# Patient Record
Sex: Female | Born: 1935
Health system: Southern US, Community
[De-identification: ages and names within clinical notes are randomized; demographics above are authoritative.]

## PROBLEM LIST (undated history)

## (undated) DIAGNOSIS — Z780 Asymptomatic menopausal state: Secondary | ICD-10-CM

## (undated) DIAGNOSIS — M899 Disorder of bone, unspecified: Secondary | ICD-10-CM

## (undated) DIAGNOSIS — Z8669 Personal history of other diseases of the nervous system and sense organs: Secondary | ICD-10-CM

## (undated) DIAGNOSIS — M949 Disorder of cartilage, unspecified: Secondary | ICD-10-CM

## (undated) DIAGNOSIS — Z8679 Personal history of other diseases of the circulatory system: Secondary | ICD-10-CM

## (undated) DIAGNOSIS — R2681 Unsteadiness on feet: Secondary | ICD-10-CM

## (undated) DIAGNOSIS — Z7189 Other specified counseling: Secondary | ICD-10-CM

## (undated) DIAGNOSIS — M810 Age-related osteoporosis without current pathological fracture: Secondary | ICD-10-CM

## (undated) DIAGNOSIS — Z85828 Personal history of other malignant neoplasm of skin: Secondary | ICD-10-CM

## (undated) DIAGNOSIS — R002 Palpitations: Secondary | ICD-10-CM

## (undated) DIAGNOSIS — R05 Cough: Secondary | ICD-10-CM

## (undated) DIAGNOSIS — E785 Hyperlipidemia, unspecified: Secondary | ICD-10-CM

## (undated) DIAGNOSIS — Z950 Presence of cardiac pacemaker: Principal | ICD-10-CM

## (undated) HISTORY — DX: Hyperlipidemia, unspecified: E78.5

## (undated) HISTORY — PX: EYE SURGERY: SHX253

## (undated) HISTORY — PX: TOOTH EXTRACTION: SUR596

## (undated) HISTORY — DX: Personal history of other diseases of the circulatory system: Z86.79

## (undated) HISTORY — DX: Cough: R05

## (undated) HISTORY — DX: Presence of cardiac pacemaker: Z95.0

## (undated) HISTORY — DX: Disorder of cartilage, unspecified: M94.9

## (undated) HISTORY — DX: Personal history of other diseases of the nervous system and sense organs: Z86.69

## (undated) HISTORY — DX: Personal history of other malignant neoplasm of skin: Z85.828

## (undated) HISTORY — PX: CHOLECYSTECTOMY: SHX55

## (undated) HISTORY — DX: Age-related osteoporosis without current pathological fracture: M81.0

## (undated) HISTORY — PX: JOINT REPLACEMENT: SHX530

## (undated) HISTORY — DX: Asymptomatic menopausal state: Z78.0

## (undated) HISTORY — DX: Other specified counseling: Z71.89

## (undated) HISTORY — PX: APPENDECTOMY: SHX54

## (undated) HISTORY — DX: Unsteadiness on feet: R26.81

## (undated) HISTORY — DX: Palpitations: R00.2

## (undated) HISTORY — DX: Disorder of bone, unspecified: M89.9

---

## 1977-02-18 HISTORY — PX: ABDOMINAL HYSTERECTOMY: SHX81

## 1995-02-19 HISTORY — PX: OTHER SURGICAL HISTORY: SHX169

## 1996-02-19 HISTORY — PX: COLON SURGERY: SHX602

## 2004-02-19 HISTORY — PX: OTHER SURGICAL HISTORY: SHX169

## 2013-02-18 HISTORY — PX: MOHS SURGERY: SHX181

## 2013-07-13 DIAGNOSIS — H409 Unspecified glaucoma: Secondary | ICD-10-CM | POA: Diagnosis not present

## 2013-07-13 DIAGNOSIS — H4011X0 Primary open-angle glaucoma, stage unspecified: Secondary | ICD-10-CM | POA: Diagnosis not present

## 2013-07-29 DIAGNOSIS — I495 Sick sinus syndrome: Secondary | ICD-10-CM | POA: Diagnosis not present

## 2013-07-29 DIAGNOSIS — I491 Atrial premature depolarization: Secondary | ICD-10-CM | POA: Diagnosis not present

## 2013-07-29 DIAGNOSIS — Z950 Presence of cardiac pacemaker: Secondary | ICD-10-CM | POA: Diagnosis not present

## 2013-10-19 DIAGNOSIS — Z0000 Encounter for general adult medical examination without abnormal findings: Secondary | ICD-10-CM | POA: Diagnosis not present

## 2013-10-19 DIAGNOSIS — R7309 Other abnormal glucose: Secondary | ICD-10-CM | POA: Diagnosis not present

## 2013-10-19 DIAGNOSIS — K130 Diseases of lips: Secondary | ICD-10-CM | POA: Diagnosis not present

## 2013-10-19 DIAGNOSIS — E7800 Pure hypercholesterolemia, unspecified: Secondary | ICD-10-CM | POA: Diagnosis not present

## 2013-10-19 DIAGNOSIS — M159 Polyosteoarthritis, unspecified: Secondary | ICD-10-CM | POA: Diagnosis not present

## 2013-10-19 DIAGNOSIS — N318 Other neuromuscular dysfunction of bladder: Secondary | ICD-10-CM | POA: Diagnosis not present

## 2013-10-19 DIAGNOSIS — Z23 Encounter for immunization: Secondary | ICD-10-CM | POA: Diagnosis not present

## 2013-10-21 DIAGNOSIS — Z23 Encounter for immunization: Secondary | ICD-10-CM | POA: Diagnosis not present

## 2013-10-27 DIAGNOSIS — L819 Disorder of pigmentation, unspecified: Secondary | ICD-10-CM | POA: Diagnosis not present

## 2013-10-27 DIAGNOSIS — D485 Neoplasm of uncertain behavior of skin: Secondary | ICD-10-CM | POA: Diagnosis not present

## 2013-10-27 DIAGNOSIS — L570 Actinic keratosis: Secondary | ICD-10-CM | POA: Diagnosis not present

## 2013-10-27 DIAGNOSIS — Z85828 Personal history of other malignant neoplasm of skin: Secondary | ICD-10-CM | POA: Diagnosis not present

## 2013-10-28 DIAGNOSIS — Z780 Asymptomatic menopausal state: Secondary | ICD-10-CM | POA: Diagnosis not present

## 2013-10-28 DIAGNOSIS — R7309 Other abnormal glucose: Secondary | ICD-10-CM | POA: Diagnosis not present

## 2013-10-28 DIAGNOSIS — Z8543 Personal history of malignant neoplasm of ovary: Secondary | ICD-10-CM | POA: Diagnosis not present

## 2013-10-28 DIAGNOSIS — E7800 Pure hypercholesterolemia, unspecified: Secondary | ICD-10-CM | POA: Diagnosis not present

## 2013-10-28 DIAGNOSIS — Z1231 Encounter for screening mammogram for malignant neoplasm of breast: Secondary | ICD-10-CM | POA: Diagnosis not present

## 2013-10-28 LAB — HEPATIC FUNCTION PANEL
ALT: 13 U/L (ref 7–35)
AST: 13 U/L (ref 13–35)
Alkaline Phosphatase: 56 U/L (ref 25–125)

## 2013-10-28 LAB — LIPID PANEL
Cholesterol: 250 mg/dL — AB (ref 0–200)
HDL: 77 mg/dL — AB (ref 35–70)
LDL CALC: 157 mg/dL

## 2013-10-28 LAB — HEMOGLOBIN A1C: HEMOGLOBIN A1C: 5.5

## 2013-10-28 LAB — BASIC METABOLIC PANEL: Potassium: 4.7 mmol/L (ref 3.4–5.3)

## 2013-10-28 LAB — VITAMIN D 25 HYDROXY (VIT D DEFICIENCY, FRACTURES): VIT D 25 HYDROXY: 65

## 2013-11-17 DIAGNOSIS — H4011X0 Primary open-angle glaucoma, stage unspecified: Secondary | ICD-10-CM | POA: Diagnosis not present

## 2013-11-18 DIAGNOSIS — C4401 Basal cell carcinoma of skin of lip: Secondary | ICD-10-CM | POA: Diagnosis not present

## 2013-11-18 DIAGNOSIS — L819 Disorder of pigmentation, unspecified: Secondary | ICD-10-CM | POA: Diagnosis not present

## 2013-11-18 DIAGNOSIS — L821 Other seborrheic keratosis: Secondary | ICD-10-CM | POA: Diagnosis not present

## 2013-11-18 DIAGNOSIS — Z85828 Personal history of other malignant neoplasm of skin: Secondary | ICD-10-CM | POA: Diagnosis not present

## 2013-12-15 DIAGNOSIS — H4011X0 Primary open-angle glaucoma, stage unspecified: Secondary | ICD-10-CM | POA: Diagnosis not present

## 2013-12-15 DIAGNOSIS — Z961 Presence of intraocular lens: Secondary | ICD-10-CM | POA: Diagnosis not present

## 2013-12-25 DIAGNOSIS — C4401 Basal cell carcinoma of skin of lip: Secondary | ICD-10-CM | POA: Diagnosis not present

## 2013-12-25 DIAGNOSIS — L578 Other skin changes due to chronic exposure to nonionizing radiation: Secondary | ICD-10-CM | POA: Diagnosis not present

## 2014-01-27 DIAGNOSIS — H4011X1 Primary open-angle glaucoma, mild stage: Secondary | ICD-10-CM | POA: Diagnosis not present

## 2014-02-03 DIAGNOSIS — Z950 Presence of cardiac pacemaker: Secondary | ICD-10-CM | POA: Diagnosis not present

## 2014-02-03 DIAGNOSIS — I495 Sick sinus syndrome: Secondary | ICD-10-CM | POA: Diagnosis not present

## 2014-08-11 DIAGNOSIS — Z45018 Encounter for adjustment and management of other part of cardiac pacemaker: Secondary | ICD-10-CM | POA: Diagnosis not present

## 2014-08-11 DIAGNOSIS — I495 Sick sinus syndrome: Secondary | ICD-10-CM | POA: Diagnosis not present

## 2014-08-11 DIAGNOSIS — Z950 Presence of cardiac pacemaker: Secondary | ICD-10-CM | POA: Diagnosis not present

## 2014-09-13 DIAGNOSIS — H4011X1 Primary open-angle glaucoma, mild stage: Secondary | ICD-10-CM | POA: Diagnosis not present

## 2014-09-29 DIAGNOSIS — Z08 Encounter for follow-up examination after completed treatment for malignant neoplasm: Secondary | ICD-10-CM | POA: Diagnosis not present

## 2014-09-29 DIAGNOSIS — Z85828 Personal history of other malignant neoplasm of skin: Secondary | ICD-10-CM | POA: Diagnosis not present

## 2014-09-29 DIAGNOSIS — C44311 Basal cell carcinoma of skin of nose: Secondary | ICD-10-CM | POA: Diagnosis not present

## 2014-12-08 DIAGNOSIS — C44311 Basal cell carcinoma of skin of nose: Secondary | ICD-10-CM | POA: Diagnosis not present

## 2014-12-08 DIAGNOSIS — Z23 Encounter for immunization: Secondary | ICD-10-CM | POA: Diagnosis not present

## 2014-12-08 DIAGNOSIS — L570 Actinic keratosis: Secondary | ICD-10-CM | POA: Diagnosis not present

## 2015-02-02 DIAGNOSIS — L570 Actinic keratosis: Secondary | ICD-10-CM | POA: Diagnosis not present

## 2015-02-07 DIAGNOSIS — Z23 Encounter for immunization: Secondary | ICD-10-CM | POA: Diagnosis not present

## 2015-02-16 DIAGNOSIS — Z4502 Encounter for adjustment and management of automatic implantable cardiac defibrillator: Secondary | ICD-10-CM | POA: Diagnosis not present

## 2015-02-16 DIAGNOSIS — I495 Sick sinus syndrome: Secondary | ICD-10-CM | POA: Diagnosis not present

## 2015-02-16 DIAGNOSIS — Z95810 Presence of automatic (implantable) cardiac defibrillator: Secondary | ICD-10-CM | POA: Diagnosis not present

## 2015-03-08 ENCOUNTER — Encounter: Payer: Self-pay | Admitting: Osteopathic Medicine

## 2015-03-08 ENCOUNTER — Ambulatory Visit (INDEPENDENT_AMBULATORY_CARE_PROVIDER_SITE_OTHER): Payer: Medicare Other

## 2015-03-08 ENCOUNTER — Ambulatory Visit (INDEPENDENT_AMBULATORY_CARE_PROVIDER_SITE_OTHER): Payer: Medicare Other | Admitting: Osteopathic Medicine

## 2015-03-08 VITALS — BP 143/53 | HR 59 | Ht 61.0 in | Wt 145.0 lb

## 2015-03-08 DIAGNOSIS — M899 Disorder of bone, unspecified: Secondary | ICD-10-CM | POA: Diagnosis not present

## 2015-03-08 DIAGNOSIS — Z8669 Personal history of other diseases of the nervous system and sense organs: Secondary | ICD-10-CM

## 2015-03-08 DIAGNOSIS — M949 Disorder of cartilage, unspecified: Secondary | ICD-10-CM | POA: Diagnosis not present

## 2015-03-08 DIAGNOSIS — Z780 Asymptomatic menopausal state: Secondary | ICD-10-CM

## 2015-03-08 DIAGNOSIS — R05 Cough: Secondary | ICD-10-CM

## 2015-03-08 DIAGNOSIS — R059 Cough, unspecified: Secondary | ICD-10-CM

## 2015-03-08 DIAGNOSIS — I517 Cardiomegaly: Secondary | ICD-10-CM

## 2015-03-08 DIAGNOSIS — R002 Palpitations: Secondary | ICD-10-CM

## 2015-03-08 DIAGNOSIS — M810 Age-related osteoporosis without current pathological fracture: Secondary | ICD-10-CM

## 2015-03-08 DIAGNOSIS — Z85828 Personal history of other malignant neoplasm of skin: Secondary | ICD-10-CM

## 2015-03-08 DIAGNOSIS — Z7189 Other specified counseling: Secondary | ICD-10-CM

## 2015-03-08 DIAGNOSIS — J449 Chronic obstructive pulmonary disease, unspecified: Secondary | ICD-10-CM

## 2015-03-08 DIAGNOSIS — E785 Hyperlipidemia, unspecified: Secondary | ICD-10-CM | POA: Insufficient documentation

## 2015-03-08 DIAGNOSIS — M85851 Other specified disorders of bone density and structure, right thigh: Secondary | ICD-10-CM | POA: Diagnosis not present

## 2015-03-08 DIAGNOSIS — Z950 Presence of cardiac pacemaker: Secondary | ICD-10-CM

## 2015-03-08 DIAGNOSIS — Z8679 Personal history of other diseases of the circulatory system: Secondary | ICD-10-CM

## 2015-03-08 HISTORY — DX: Cough, unspecified: R05.9

## 2015-03-08 HISTORY — DX: Disorder of bone, unspecified: M89.9

## 2015-03-08 HISTORY — DX: Presence of cardiac pacemaker: Z95.0

## 2015-03-08 HISTORY — DX: Personal history of other diseases of the nervous system and sense organs: Z86.69

## 2015-03-08 HISTORY — DX: Hyperlipidemia, unspecified: E78.5

## 2015-03-08 HISTORY — DX: Personal history of other diseases of the circulatory system: Z86.79

## 2015-03-08 HISTORY — DX: Palpitations: R00.2

## 2015-03-08 HISTORY — DX: Asymptomatic menopausal state: Z78.0

## 2015-03-08 HISTORY — DX: Other specified counseling: Z71.89

## 2015-03-08 HISTORY — DX: Age-related osteoporosis without current pathological fracture: M81.0

## 2015-03-08 HISTORY — DX: Personal history of other malignant neoplasm of skin: Z85.828

## 2015-03-08 LAB — CBC WITH DIFFERENTIAL/PLATELET
Basophils Absolute: 0 10*3/uL (ref 0.0–0.1)
Basophils Relative: 0 % (ref 0–1)
Eosinophils Absolute: 0.1 10*3/uL (ref 0.0–0.7)
Eosinophils Relative: 1 % (ref 0–5)
HEMATOCRIT: 43.3 % (ref 36.0–46.0)
HEMOGLOBIN: 14.5 g/dL (ref 12.0–15.0)
LYMPHS ABS: 1.1 10*3/uL (ref 0.7–4.0)
LYMPHS PCT: 14 % (ref 12–46)
MCH: 31.5 pg (ref 26.0–34.0)
MCHC: 33.5 g/dL (ref 30.0–36.0)
MCV: 93.9 fL (ref 78.0–100.0)
MONO ABS: 0.6 10*3/uL (ref 0.1–1.0)
MONOS PCT: 8 % (ref 3–12)
MPV: 9.2 fL (ref 8.6–12.4)
NEUTROS ABS: 5.9 10*3/uL (ref 1.7–7.7)
Neutrophils Relative %: 77 % (ref 43–77)
Platelets: 240 10*3/uL (ref 150–400)
RBC: 4.61 MIL/uL (ref 3.87–5.11)
RDW: 13.4 % (ref 11.5–15.5)
WBC: 7.6 10*3/uL (ref 4.0–10.5)

## 2015-03-08 LAB — LIPID PANEL
CHOLESTEROL: 205 mg/dL — AB (ref 125–200)
HDL: 55 mg/dL (ref >=46)
LDL Cholesterol: 120 mg/dL (ref <130)
TRIGLYCERIDES: 148 mg/dL (ref <150)
Total CHOL/HDL Ratio: 3.7 Ratio (ref <=5.0)
VLDL: 30 mg/dL (ref <30)

## 2015-03-08 LAB — COMPLETE METABOLIC PANEL WITH GFR
ALBUMIN: 3.9 g/dL (ref 3.6–5.1)
ALK PHOS: 52 U/L (ref 33–130)
ALT: 14 U/L (ref 6–29)
AST: 14 U/L (ref 10–35)
BUN: 26 mg/dL — AB (ref 7–25)
CALCIUM: 9 mg/dL (ref 8.6–10.4)
CHLORIDE: 105 mmol/L (ref 98–110)
CO2: 26 mmol/L (ref 20–31)
CREATININE: 0.69 mg/dL (ref 0.60–0.88)
GFR, Est Non African American: 80 mL/min (ref >=60)
Glucose, Bld: 73 mg/dL (ref 65–99)
Potassium: 4.1 mmol/L (ref 3.5–5.3)
Sodium: 140 mmol/L (ref 135–146)
Total Bilirubin: 0.6 mg/dL (ref 0.2–1.2)
Total Protein: 6.7 g/dL (ref 6.1–8.1)

## 2015-03-08 LAB — MAGNESIUM: Magnesium: 1.5 mg/dL (ref 1.5–2.5)

## 2015-03-08 LAB — TSH: TSH: 2.772 u[IU]/mL (ref 0.350–4.500)

## 2015-03-08 NOTE — Progress Notes (Signed)
HPI: Amanda Travis is a 82 y.o. female who presents to [place] today for chief complaint of:  Chief Complaint  Patient presents with  . Establish Care    "Afib showed up in pacemaker check. Little dry cough."    Son in law accompanies her to her visit today.   Reports being told that Afib was picked up on her most recent pacemaker interrogation 01/2015, she bring the report,  High ventricular rate noted, nothing on the report mentions Afib.   Previous PCP Dr Wynona Meals MD p [phone] last documentation 09/05/14, old med list 10/19/2013. Pt states no changes other than no amoxicillin now, is off Fosamax because her dentist told her it would slow healing from her tooth removal, this was some time ago.   Last labs pt has results for were done 10/29/13. No significant concerns on review, see abstracts and scanned docs  [place] - on ASA and Omega3, pt denies chest pain, palpitations was checked off on her intake form however patient denies these when asked directly.  RESPIRATORY - dry cough, goin gon a few months, never smoker, remote exposure to secondhand smoke, VASCULAR/LYMPH - no issues NEURO - no issues RENAL - no issues GASTROINTESTINAL - Hx colon surgery. On Colace daily ENT - Seasonal Allergies, on Claritin. Uses hearing aids EYE - Hx Glaucoma, on Xalatan and Timolol, artificial tears SKIN - Hx skin cancer, s/p Mohs. Taking imiquimod fo rspot on nose MUSCULOSKELETAL - on Calcium/VitD supplement, previously on Fasamax, taking Glucosamine. (+) arthritis and back pain- chronic, no recent exacerbations INFECTIOUS DISEASE - no issues HEM/ONC - Hx Iron Def Anemia, taking Iron. Taking Folate.  ENDOCRINE - no known DM or thyroid problems GENITOURINARY - Hx urinary frequency/urge incontinence on Ditroppan OTHER - Additional OTC supplements include Flaxseed, Potassium, Echinacea, Garlic, Lysine, Melatonin,  Vitamin C, Policosanol (marketed for heart health)   Past medical, social and family history reviewed: Past Medical History  Diagnosis Date  . Disorder of bone and cartilage 03/08/2015  . Osteoporosis 03/08/2015    previously on Fosamax, pt told to stop this due to dental procedure. Will repeat DEXA.    . Cardiac pacemaker in situ 03/08/2015    Placed for Tachy/Brady syndrome   . Cardiac risk counseling 03/08/2015    Await lipids ot calculate ASCVD risk score. No hx known MI/CVA   . Cough 03/08/2015  . History of glaucoma 03/08/2015  . History of skin cancer 03/08/2015  . History of tachycardia-bradycardia syndrome 03/08/2015  . Hyperlipidemia 03/08/2015  . Palpitations 03/08/2015    Pt concerned for Afib.CHA2DDS2-VASc Score 3.2%. Pt reports pacemaker check revealed AFib but I don't see Afib on PM reports, EKG sinus   . Postmenopausal 03/08/2015   Past Surgical History  Procedure Laterality Date  . Appendectomy    . Eye surgery      CATARACT  . Ovarian cancer  1997  . Joint replacement Bilateral     KNEE  . Cholecystectomy    . Mohs surgery  2015  . Tooth extraction    . Blood clot  2006  . Colon surgery  1998  . Abdominal hysterectomy  1979   Social History  Substance Use Topics  . Smoking status: Never Smoker   . Smokeless tobacco: Not on file  . Alcohol Use: No   Family History  Problem Relation Age of Onset  . Stroke Mother   . Heart disease Sister   .  Dementia Brother   . Heart disease Brother     Current Outpatient Prescriptions  Medication Sig Dispense Refill  . ARTIFICIAL TEARS 0.1-0.3 % SOLN Apply to eye.    Marland Kitchen aspirin EC 81 MG tablet Take 81 mg by mouth daily.    . Calcium Carbonate-Vitamin D3 600-400 MG-UNIT TABS Take by mouth.    . Cholecalciferol (VITAMIN D3) 2000 units capsule Take 2,000 Units by mouth daily.    . clotrimazole (LOTRIMIN) 1 % cream Apply 1 application topically 2 (two) times daily.    . diclofenac (VOLTAREN) 75 MG EC tablet Take 75 mg by  mouth 2 (two) times daily.    Marland Kitchen docusate sodium (COLACE) 100 MG capsule Take 100 mg by mouth daily.    . Echinacea 125 MG CAPS Take by mouth. Take 200 mg by mouth daily    . ferrous sulfate 325 (65 FE) MG tablet Take 325 mg by mouth daily with breakfast.    . Flaxseed, Linseed, (FLAXSEED OIL PO) Take by mouth.    Marland Kitchen FLUZONE HIGH-DOSE 0.5 ML SUSY ADM [id number] IM UTD  0  . folic acid (FOLVITE) 1 MG tablet Take 1 mg by mouth daily.    . Garlic [id number] MG TABS Take by mouth. Take 1000 mg by mouth daily    . Glucosamine Sulfate 500 MG TABS Take by mouth. Take 200 mg by mouth daily    . imiquimod (ALDARA) 5 % cream Apply topically at bedtime. Apply every other day for 2 months    . latanoprost (XALATAN) 0.005 % ophthalmic solution 1 drop at bedtime.    Marland Kitchen loratadine (CLARITIN) 10 MG tablet Take 10 mg by mouth daily.    Marland Kitchen Lysine 500 MG CAPS Take by mouth. Take 1000 mg by mouth daily    . Melatonin 5 MG TABS Take by mouth. Take 5 mg by mouth daily    . Multiple Vitamin (MULTIVITAMIN) tablet Take 1 tablet by mouth daily.    . OMEGA-3 FATTY ACIDS PO Take by mouth. Take 1 tablet by mouth daily    . omeprazole (PRILOSEC) 20 MG capsule Take 20 mg by mouth daily.    Marland Kitchen oxybutynin (DITROPAN) 5 MG tablet Take 5 mg by mouth 3 (three) times daily.    Marland Kitchen PNEUMOVAX 23 25 MCG/0.5ML injection ADM 0.5ML IM UTD  0  . Policosanol 10 MG CAPS Take by mouth. Take by mouth 2 times daily    . Potassium 99 MG TABS Take by mouth.    . timolol (TIMOPTIC) 0.25 % ophthalmic solution 1 drop 2 (two) times daily.    . vitamin C (ASCORBIC ACID) 500 MG tablet Take 500 mg by mouth daily.     No current facility-administered medications for this visit.   Allergies  Allergen Reactions  . Keflex [Cephalexin]   . Tetracyclines & Related   . Valtrex [Valacyclovir Hcl]       Review of Systems: CONSTITUTIONAL:  No  fever, no chills, No  unintentional weight changes HEAD/EYES/EARS/NOSE/THROAT: No  headache, no vision change, no hearing  change, No  sore throat, No  sinus pressure CARDIAC: No  chest pain, No  pressure, (+) palpitations, No  orthopnea RESPIRATORY: No  cough, No  shortness of breath/wheeze GASTROINTESTINAL: No  nausea, No  vomiting, No  abdominal pain, No  blood in stool, No  diarrhea, No  constipation  MUSCULOSKELETAL: No  myalgia/arthralgia GENITOURINARY: No  incontinence, No  abnormal genital bleeding/discharge SKIN: No  rash/wounds/concerning lesions HEM/ONC: No  easy  bruising/bleeding, No  abnormal lymph node ENDOCRINE: No polyuria/polydipsia/polyphagia, No  heat/cold intolerance  NEUROLOGIC: No  weakness, No  dizziness, No  slurred speech PSYCHIATRIC: No  concerns with depression, No  concerns with anxiety, No sleep problems  Exam:  BP 143/53 mmHg  Pulse 59  Ht 5\' 1"  (1.549 m)  Wt 145 lb (65.772 kg)  BMI 27.41 kg/m2 Constitutional: VS see above. General Appearance: alert, well-developed, well-nourished, NAD Eyes: Normal lids and conjunctive, non-icteric sclera,  Ears, Nose, Mouth, Throat: MMM, Normal external inspection ears/nares/mouth/lips/gums, .  Neck: No masses, trachea midline. No thyroid enlargement/tenderness/mass appreciated. No lymphadenopathy Respiratory: Normal respiratory effort. no wheeze, no rhonchi, no rales Cardiovascular: S1/S2 normal, no murmur, no rub/gallop auscultated. RRR. No lower extremity edema. Neurological: No cranial nerve deficit on limited exam. Motor and sensation intact and symmetric Skin: warm, dry, intact. No rash/ulcer. No concerning nevi or subq nodules on limited exam.   Psychiatric: Normal judgment/insight. Normal mood and affect. Oriented x3.    EKG interpretation: Rate: 64  Rhythm: sinus No ST/T changes concerning for acute ischemia/infarct  CXR on personal review Cardiomediastinal silhouette/heart size: abnormal - mild enlarged heart, hyperinflation of lungs Obvious bony abnormality: none Infiltrate: none Mass or other opacity: none Atelectasis:  none Diaphragms: abnormal - flattened Lateral view: normal  Pt counseled that radiologist will review the images as well, our office will call if the formal read reveals any significant findings other than what has been noted above.     ASSESSMENT/PLAN:  Cardiac pacemaker in situ - Placed for Tachy/Brady syndrome - Plan: Ambulatory referral to Cardiology  Cough - Plan: DG Chest 2 View  History of glaucoma  Palpitations - Pt concerned for Afib.CHA2DDS2-VASc Score 3.2%. Pt reports pacemaker check revealed AFib but I don't see Afib on PM reports, EKG sinus - Plan: CBC with Differential/Platelet, COMPLETE METABOLIC PANEL WITH GFR, TSH, Magnesium, EKG 12-Lead, EKG 12-Lead  Cardiac risk counseling - Await lipids ot calculate ASCVD risk score. No hx known MI/CVA  History of tachycardia-bradycardia syndrome - Plan: Ambulatory referral to Cardiology  Postmenopausal - Plan: VITAMIN D 25 Hydroxy (Vit-D Deficiency, Fractures), DG Bone Density  Osteoporosis - previously on Fosamax, pt told to stop this due to dental procedure. Will repeat DEXA.  - Plan: DG Bone Density  Disorder of bone and cartilage - Plan: VITAMIN D 25 Hydroxy (Vit-D Deficiency, Fractures)  Hyperlipidemia - Plan: Lipid panel  History of skin cancer  Return in about 4 weeks (around 04/05/2015), or sooner if needed if cough continues to bother you, for [facility] VISIT.

## 2015-03-08 NOTE — Patient Instructions (Signed)
For cough - we will call you if anything shows up on your Xray. Try Flonase or other steroid nasal spray for allergies, can also try antacid medication such as Zantac for a few weeks and see if this helps.   We will call you with lab results by the end of this week.  You should hear about an appointment with a local cardiologist by the end of this week, and also an appointment to repeat your bone density test.   Please call our office if you don't hear back about these things.   Thanks so much for coming in today, and welcome to our practice!

## 2015-03-09 ENCOUNTER — Encounter: Payer: Self-pay | Admitting: Osteopathic Medicine

## 2015-03-09 LAB — VITAMIN D 25 HYDROXY (VIT D DEFICIENCY, FRACTURES): VIT D 25 HYDROXY: 43 ng/mL (ref 30–100)

## 2015-03-13 ENCOUNTER — Telehealth: Payer: Self-pay | Admitting: Osteopathic Medicine

## 2015-03-13 DIAGNOSIS — Z780 Asymptomatic menopausal state: Secondary | ICD-10-CM

## 2015-03-13 DIAGNOSIS — Z8739 Personal history of other diseases of the musculoskeletal system and connective tissue: Secondary | ICD-10-CM

## 2015-03-13 NOTE — Telephone Encounter (Signed)
Ok orders placed.

## 2015-03-13 NOTE — Telephone Encounter (Signed)
Patient called and request to have an order for a Bone Density test repeat. Thanks

## 2015-03-21 ENCOUNTER — Encounter: Payer: Self-pay | Admitting: Osteopathic Medicine

## 2015-04-04 DIAGNOSIS — Z950 Presence of cardiac pacemaker: Secondary | ICD-10-CM | POA: Diagnosis not present

## 2015-04-05 ENCOUNTER — Encounter: Payer: Self-pay | Admitting: Osteopathic Medicine

## 2015-04-05 ENCOUNTER — Ambulatory Visit (INDEPENDENT_AMBULATORY_CARE_PROVIDER_SITE_OTHER): Payer: Medicare Other | Admitting: Osteopathic Medicine

## 2015-04-05 VITALS — BP 144/91 | HR 81 | Ht 61.0 in | Wt 145.0 lb

## 2015-04-05 DIAGNOSIS — Z23 Encounter for immunization: Secondary | ICD-10-CM

## 2015-04-05 DIAGNOSIS — Z0000 Encounter for general adult medical examination without abnormal findings: Secondary | ICD-10-CM

## 2015-04-05 DIAGNOSIS — M810 Age-related osteoporosis without current pathological fracture: Secondary | ICD-10-CM | POA: Diagnosis not present

## 2015-04-05 MED ORDER — CALCIUM CARBONATE-VITAMIN D 600-400 MG-UNIT PO CHEW
2.0000 | CHEWABLE_TABLET | Freq: Every day | ORAL | Status: AC
Start: 2015-04-05 — End: ?

## 2015-04-05 MED ORDER — DICLOFENAC SODIUM 75 MG PO TBEC: 75.0000 mg | DELAYED_RELEASE_TABLET | Freq: Two times a day (BID) | ORAL | Status: DC

## 2015-04-05 MED ORDER — OXYBUTYNIN CHLORIDE 5 MG PO TABS: 5.0000 mg | ORAL_TABLET | Freq: Two times a day (BID) | ORAL | Status: DC

## 2015-04-05 NOTE — Patient Instructions (Addendum)
Please ask her oral surgeon about whether she had a diagnosis of Osteonecrosis of the Jaw - this may affect her treatments for osteoporosis. If I could get their records, that would also be helpful but not necessary.      Fall Prevention in the Home  Falls can cause injuries. They can happen to people of all ages. There are many things you can do to make your home safe and to help prevent falls.  WHAT CAN I DO ON THE OUTSIDE OF MY HOME?  Regularly fix the edges of walkways and driveways and fix any cracks.  Remove anything that might make you trip as you walk through a door, such as a raised step or threshold.  Trim any bushes or trees on the path to your home.  Use bright outdoor lighting.  Clear any walking paths of anything that might make someone trip, such as rocks or tools.  Regularly check to see if handrails are loose or broken. Make sure that both sides of any steps have handrails.  Any raised decks and porches should have guardrails on the edges.  Have any leaves, snow, or ice cleared regularly.  Use sand or salt on walking paths during winter.  Clean up any spills in your garage right away. This includes oil or grease spills. WHAT CAN I DO IN THE BATHROOM?   Use night lights.  Install grab bars by the toilet and in the tub and shower. Do not use towel bars as grab bars.  Use non-skid mats or decals in the tub or shower.  If you need to sit down in the shower, use a plastic, non-slip stool.  Keep the floor dry. Clean up any water that spills on the floor as soon as it happens.  Remove soap buildup in the tub or shower regularly.  Attach bath mats securely with double-sided non-slip rug tape.  Do not have throw rugs and other things on the floor that can make you trip. WHAT CAN I DO IN THE BEDROOM?  Use night lights.  Make sure that you have a light by your bed that is easy to reach.  Do not use any sheets or blankets that are too big for your bed. They  should not hang down onto the floor.  Have a firm chair that has side arms. You can use this for support while you get dressed.  Do not have throw rugs and other things on the floor that can make you trip. WHAT CAN I DO IN THE KITCHEN?  Clean up any spills right away.  Avoid walking on wet floors.  Keep items that you use a lot in easy-to-reach places.  If you need to reach something above you, use a strong step stool that has a grab bar.  Keep electrical cords out of the way.  Do not use floor polish or wax that makes floors slippery. If you must use wax, use non-skid floor wax.  Do not have throw rugs and other things on the floor that can make you trip. WHAT CAN I DO WITH MY STAIRS?  Do not leave any items on the stairs.  Make sure that there are handrails on both sides of the stairs and use them. Fix handrails that are broken or loose. Make sure that handrails are as long as the stairways.  Check any carpeting to make sure that it is firmly attached to the stairs. Fix any carpet that is loose or worn.  Avoid having throw  rugs at the top or bottom of the stairs. If you do have throw rugs, attach them to the floor with carpet tape.  Make sure that you have a light switch at the top of the stairs and the bottom of the stairs. If you do not have them, ask someone to add them for you. WHAT ELSE CAN I DO TO HELP PREVENT FALLS?  Wear shoes that:  Do not have high heels.  Have rubber bottoms.  Are comfortable and fit you well.  Are closed at the toe. Do not wear sandals.  If you use a stepladder:  Make sure that it is fully opened. Do not climb a closed stepladder.  Make sure that both sides of the stepladder are locked into place.  Ask someone to hold it for you, if possible.  Clearly mark and make sure that you can see:  Any grab bars or handrails.  First and last steps.  Where the edge of each step is.  Use tools that help you move around (mobility aids) if  they are needed. These include:  Canes.  Walkers.  Scooters.  Crutches.  Turn on the lights when you go into a dark area. Replace any light bulbs as soon as they burn out.  Set up your furniture so you have a clear path. Avoid moving your furniture around.  If any of your floors are uneven, fix them.  If there are any pets around you, be aware of where they are.  Review your medicines with your doctor. Some medicines can make you feel dizzy. This can increase your chance of falling. Ask your doctor what other things that you can do to help prevent falls.   This information is not intended to replace advice given to you by your health care provider. Make sure you discuss any questions you have with your health care provider.   Document Released: 12/01/2008 Document Revised: 06/21/2014 Document Reviewed: 03/11/2014 Elsevier Interactive Patient Education [employer].

## 2015-04-05 NOTE — Progress Notes (Signed)
Subjective:    Amanda Travis is a 82 y.o. female who presents for Medicare Annual/Subsequent preventive examination.  Preventive Screening-Counseling & Management  Tobacco History  Smoking status  . Never Smoker   Smokeless tobacco  . Not on file     Problems Prior to Visit Past Medical History  Diagnosis Date  . Disorder of bone and cartilage 03/08/2015  . Osteoporosis 03/08/2015    previously on Fosamax, pt told to stop this due to dental procedure. Will repeat DEXA.    . Cardiac pacemaker in situ 03/08/2015    Placed for Tachy/Brady syndrome   . Cardiac risk counseling 03/08/2015    Await lipids ot calculate ASCVD risk score. No hx known MI/CVA   . Cough 03/08/2015  . History of glaucoma 03/08/2015  . History of skin cancer 03/08/2015  . History of tachycardia-bradycardia syndrome 03/08/2015  . Hyperlipidemia 03/08/2015  . Palpitations 03/08/2015    Pt concerned for Afib.CHA2DDS2-VASc Score 3.2%. Pt reports pacemaker check revealed AFib but I don't see Afib on PM reports, EKG sinus   . Postmenopausal 03/08/2015    Current Problems (verified) Patient Active Problem List   Diagnosis Date Noted  . Cardiac pacemaker in situ 03/08/2015  . Cough 03/08/2015  . History of glaucoma 03/08/2015  . Palpitations 03/08/2015  . Cardiac risk counseling 03/08/2015  . History of tachycardia-bradycardia syndrome 03/08/2015  . Postmenopausal 03/08/2015  . Osteoporosis 03/08/2015  . Disorder of bone and cartilage 03/08/2015  . Hyperlipidemia 03/08/2015  . History of skin cancer 03/08/2015    Medications Prior to Visit Current Outpatient Prescriptions on File Prior to Visit  Medication Sig Dispense Refill  . ARTIFICIAL TEARS 0.1-0.3 % SOLN Apply to eye as needed.     Marland Kitchen aspirin EC 81 MG tablet Take 81 mg by mouth daily.    . Calcium Carbonate-Vitamin D3 600-400 MG-UNIT TABS Take by mouth.    . Cholecalciferol (VITAMIN D3) 2000 units capsule Take 2,000 Units by mouth daily.    . diclofenac  (VOLTAREN) 75 MG EC tablet Take 75 mg by mouth 2 (two) times daily.    . Echinacea 125 MG CAPS Take by mouth. Take 200 mg by mouth daily    . ferrous sulfate 325 (65 FE) MG tablet Take 325 mg by mouth daily with breakfast.    . Flaxseed, Linseed, (FLAXSEED OIL PO) Take by mouth.    Marland Kitchen FLUZONE HIGH-DOSE 0.5 ML SUSY ADM 0.5ML IM UTD  0  . Garlic [id number] MG TABS Take by mouth. Take 1000 mg by mouth daily    . Glucosamine Sulfate 500 MG TABS Take by mouth. Take 200 mg by mouth daily    . imiquimod (ALDARA) 5 % cream Apply topically at bedtime. Apply every other day for 2 months    . latanoprost (XALATAN) 0.005 % ophthalmic solution 1 drop at bedtime.    Marland Kitchen loratadine (CLARITIN) 10 MG tablet Take 10 mg by mouth daily.    Marland Kitchen Lysine 500 MG CAPS Take by mouth. Take 1000 mg by mouth daily    . Melatonin 5 MG TABS Take by mouth. Take 5 mg by mouth daily    . Multiple Vitamin (MULTIVITAMIN) tablet Take 1 tablet by mouth daily.    . OMEGA-3 FATTY ACIDS PO Take by mouth. Take 1 tablet by mouth daily    . omeprazole (PRILOSEC) 20 MG capsule Take 20 mg by mouth daily.    Marland Kitchen oxybutynin (DITROPAN) 5 MG tablet Take 5 mg by mouth 3 (three)  times daily.    Marland Kitchen PNEUMOVAX 23 25 MCG/0.5ML injection ADM 0.5ML IM UTD  0  . Policosanol 10 MG CAPS Take by mouth. Take by mouth 2 times daily    . Potassium 99 MG TABS Take by mouth.    . timolol (TIMOPTIC) 0.25 % ophthalmic solution 1 drop 2 (two) times daily.    . vitamin C (ASCORBIC ACID) 500 MG tablet Take 500 mg by mouth daily.     No current facility-administered medications on file prior to visit.    Current Medications (verified) Current Outpatient Prescriptions  Medication Sig Dispense Refill  . ARTIFICIAL TEARS 0.1-0.3 % SOLN Apply to eye as needed.     Marland Kitchen aspirin EC 81 MG tablet Take 81 mg by mouth daily.    . Calcium Carbonate-Vitamin D3 600-400 MG-UNIT TABS Take by mouth.    . Cholecalciferol (VITAMIN D3) 2000 units capsule Take 2,000 Units by mouth daily.    .  diclofenac (VOLTAREN) 75 MG EC tablet Take 75 mg by mouth 2 (two) times daily.    . Echinacea 125 MG CAPS Take by mouth. Take 200 mg by mouth daily    . ferrous sulfate 325 (65 FE) MG tablet Take 325 mg by mouth daily with breakfast.    . Flaxseed, Linseed, (FLAXSEED OIL PO) Take by mouth.    Marland Kitchen FLUZONE HIGH-DOSE 0.5 ML SUSY ADM 0.5ML IM UTD  0  . Garlic [id number] MG TABS Take by mouth. Take 1000 mg by mouth daily    . Glucosamine Sulfate 500 MG TABS Take by mouth. Take 200 mg by mouth daily    . imiquimod (ALDARA) 5 % cream Apply topically at bedtime. Apply every other day for 2 months    . latanoprost (XALATAN) 0.005 % ophthalmic solution 1 drop at bedtime.    Marland Kitchen loratadine (CLARITIN) 10 MG tablet Take 10 mg by mouth daily.    Marland Kitchen Lysine 500 MG CAPS Take by mouth. Take 1000 mg by mouth daily    . Melatonin 5 MG TABS Take by mouth. Take 5 mg by mouth daily    . Multiple Vitamin (MULTIVITAMIN) tablet Take 1 tablet by mouth daily.    . OMEGA-3 FATTY ACIDS PO Take by mouth. Take 1 tablet by mouth daily    . omeprazole (PRILOSEC) 20 MG capsule Take 20 mg by mouth daily.    Marland Kitchen oxybutynin (DITROPAN) 5 MG tablet Take 5 mg by mouth 3 (three) times daily.    Marland Kitchen PNEUMOVAX 23 25 MCG/0.5ML injection ADM 0.5ML IM UTD  0  . Policosanol 10 MG CAPS Take by mouth. Take by mouth 2 times daily    . Potassium 99 MG TABS Take by mouth.    . timolol (TIMOPTIC) 0.25 % ophthalmic solution 1 drop 2 (two) times daily.    . vitamin C (ASCORBIC ACID) 500 MG tablet Take 500 mg by mouth daily.     No current facility-administered medications for this visit.     Allergies (verified) Keflex; Tetracyclines & related; and Valtrex   PAST HISTORY  Family History Family History  Problem Relation Age of Onset  . Stroke Mother   . Heart disease Sister   . Dementia Brother   . Heart disease Brother     Social History Social History  Substance Use Topics  . Smoking status: Never Smoker   . Smokeless tobacco: Not on file  .  Alcohol Use: No     Are there smokers in your home (other than  you)? No  Risk Factors Current exercise habits: The patient does not participate in regular exercise at present.  Dietary issues discussed: No concerns    Cardiac risk factors: advanced age (older than 75 for men, 47 for women), dyslipidemia and pacemaker.  Depression Screen (Note: if answer to either of the following is "Yes", a more complete depression screening is indicated)   Over the past two weeks, have you felt down, depressed or hopeless? No  Over the past two weeks, have you felt little interest or pleasure in doing things? No  Have you lost interest or pleasure in daily life? No  Do you often feel hopeless? No  Do you cry easily over simple problems? No  Activities of Daily Living In your present state of health, do you have any difficulty performing the following activities?:  Driving? No - doesn't drive Managing money?  No Feeding yourself? No Getting from bed to chair? No Climbing a flight of stairs? Yes Preparing food and eating?: No Bathing or showering? No Getting dressed: No Getting to the toilet? No Using the toilet:No Moving around from place to place: No In the past year have you fallen or had a near fall?:stumble on unsteady ground     Hearing Difficulties: uses hearing aids Do you often ask people to speak up or repeat themselves? Hearing aids Do you experience ringing or noises in your ears? No Do you have difficulty understanding soft or whispered voices? no   Do you feel that you have a problem with memory? No  Do you often misplace items? No  Do you feel safe at home?  No  Cognitive Testing  Alert? Yes  Normal Appearance?Yes  Oriented to person? Yes  Place? Yes   Time? Yes  Advanced Directives have been discussed with the patient? Yes  List the Names of Other Physician/Practitioners you currently use: 1.  Lamar Blinks, Cardiologist   Indicate any recent Medical Services you  may have received from other than [facility] providers in the past year (date may be approximate).  Immunization History  Administered Date(s) Administered  . Influenza-Unspecified 12/08/2014  . Pneumococcal Polysaccharide-23 12/08/2014    Screening Tests Health Maintenance  Topic Date Due  . TETANUS/TDAP  01/21/1950  . ZOSTAVAX  01/22/1991  . INFLUENZA VACCINE  09/19/2015  . PNA vac Low Risk Adult (2 of 2 - PCV13) 12/08/2015  . DEXA SCAN  Completed    All answers were reviewed with the patient and necessary referrals were made:  Emeterio Reeve, DO   04/05/2015   History reviewed: allergies, current medications, past family history, past medical history, past social history, past surgical history and problem list  Review of Systems Pertinent items are noted in HPI.    Objective:     Vision by Snellen chart: right [id number] declines measurement, left [id number] declines measurement  Body mass index is 27.41 kg/(m^2). BP 144/91 mmHg  Pulse 81  Ht 5\' 1"  (1.549 m)  Wt 145 lb (65.772 kg)  BMI 27.41 kg/m2       Assessment:     Routine history and physical examination of adult  Tetanus vaccination updated today.  Need dental records re: jaw osteonecrosis  Prior to initiating treatment with bisphosphonate or Prolia.  Fall precautions reviewed - will refer to PT.  Advance directive in place - family has access to copies      Plan:     During the course of the visit the patient was educated and counseled about appropriate screening  and preventive services including:    Td vaccine  Nutrition counseling   Advanced directives: power of attorney for healthcare on file, has an advanced directive - a copy has been provided  Fall risk  Diet review for nutrition referral? Yes ____  Not Indicated __X__   Patient Instructions (the written plan) was given to the patient.  Medicare Attestation I have personally reviewed: The patient's medical and social  history Their use of alcohol, tobacco or illicit drugs Their current medications and supplements The patient's functional ability including ADLs,fall risks, home safety risks, cognitive, and hearing and visual impairment Diet and physical activities Evidence for depression or mood disorders  The patient's weight, height, BMI, and visual acuity have been recorded in the chart.  I have made referrals, counseling, and provided education to the patient based on review of the above and I have provided the patient with a written personalized care plan for preventive services.     Emeterio Reeve, DO   04/05/2015

## 2015-04-07 ENCOUNTER — Ambulatory Visit (INDEPENDENT_AMBULATORY_CARE_PROVIDER_SITE_OTHER): Payer: Medicare Other | Admitting: Rehabilitative and Restorative Service Providers"

## 2015-04-07 ENCOUNTER — Encounter: Payer: Self-pay | Admitting: Rehabilitative and Restorative Service Providers"

## 2015-04-07 DIAGNOSIS — R2681 Unsteadiness on feet: Secondary | ICD-10-CM

## 2015-04-07 DIAGNOSIS — Z7409 Other reduced mobility: Secondary | ICD-10-CM

## 2015-04-07 DIAGNOSIS — R29898 Other symptoms and signs involving the musculoskeletal system: Secondary | ICD-10-CM | POA: Diagnosis not present

## 2015-04-07 DIAGNOSIS — R293 Abnormal posture: Secondary | ICD-10-CM | POA: Diagnosis present

## 2015-04-07 DIAGNOSIS — R269 Unspecified abnormalities of gait and mobility: Secondary | ICD-10-CM | POA: Diagnosis not present

## 2015-04-07 NOTE — Patient Instructions (Addendum)
Stand with back at wall. Feet shoulder width apart and slightly away from the wall. Push hips forward  Hold 10 sec  10 reps  2-3 times each day    Stand during each commercial in 30 min TV show  2-3 times/day

## 2015-04-07 NOTE — Therapy (Signed)
[employer] [place], [place], [address] Phone: [phone]   Fax:  [phone]  Physical Therapy Evaluation  Patient Details  Name: Amanda Travis MRN: [id number] Date of Birth: Nov 15, 1928 Referring Provider: Dr. Emeterio Reeve   Encounter Date: 04/07/2015      PT End of Session - 04/07/15 1354    Visit Number 1   Number of Visits 12   Date for PT Re-Evaluation 05/19/15   PT Start Time [id number]   PT Stop Time 1443   PT Time Calculation (min) 49 min   Activity Tolerance Patient tolerated treatment well      Past Medical History  Diagnosis Date  . Disorder of bone and cartilage 03/08/2015  . Osteoporosis 03/08/2015    previously on Fosamax, pt told to stop this due to dental procedure. Will repeat DEXA.    . Cardiac pacemaker in situ 03/08/2015    Placed for Tachy/Brady syndrome   . Cardiac risk counseling 03/08/2015    Await lipids ot calculate ASCVD risk score. No hx known MI/CVA   . Cough 03/08/2015  . History of glaucoma 03/08/2015  . History of skin cancer 03/08/2015  . History of tachycardia-bradycardia syndrome 03/08/2015  . Hyperlipidemia 03/08/2015  . Palpitations 03/08/2015    Pt concerned for Afib.CHA2DDS2-VASc Score 3.2%. Pt reports pacemaker check revealed AFib but I don't see Afib on PM reports, EKG sinus   . Postmenopausal 03/08/2015    Past Surgical History  Procedure Laterality Date  . Appendectomy    . Eye surgery      CATARACT  . Ovarian cancer  1997  . Joint replacement Bilateral     KNEE  . Cholecystectomy    . Mohs surgery  2015  . Tooth extraction    . Blood clot  2006  . Colon surgery  1998  . Abdominal hysterectomy  1979    There were no vitals filed for this visit.  Visit Diagnosis:  Abnormal posture - Plan: PT plan of care cert/re-cert  Abnormal gait - Plan: PT plan of care cert/re-cert  Weakness of both lower extremities - Plan: PT plan of care  cert/re-cert  Impaired functional mobility, balance, and endurance - Plan: PT plan of care cert/re-cert  Unsteadiness - Plan: PT plan of care cert/re-cert      Subjective Assessment - 04/07/15 1358    Subjective Patient reports that she "is not walking right". She walks with a 4 point cane at home and uses the walker if she is in crowds. She has used an assistive device for the past 3 years. She reports that he legs "feel weak" and her posture is not as good as it should be. [place] 8/16 with injury to LB and cocyx. Treated with chiropractic care with improvement.    Pertinent History bilat TKA [id number] yrs ago complicated with fracture of leg casted for 8 weeks; then braced for 6-8 weeks before therapy; 8 yrs ago; LBP 5-10; pacemaker  2011; ovarian cancer ~20 yrs ago; mets to kidney and intestine which was removed; gall bladder surgery ~5 yrs ago   How long can you sit comfortably? several hours   How long can you stand comfortably? leans on objects to stand   How long can you walk comfortably? 4-5 min (about 1 block)    Patient Stated Goals wants to improve walking - the strength/distance/safety; go up and down curbs   Currently in Pain? No/denies   Aggravating Factors  pain in Rt >  Lt knee with walking             [facility] PT Assessment - 04/07/15 0001    Assessment   Medical Diagnosis abnormal gait pattern decreased safety    Referring Provider Dr. Emeterio Reeve    Onset Date/Surgical Date 09/19/14   Hand Dominance Right   Next MD Visit 6/17   Prior Therapy none for current problems    Precautions   Precautions None   Balance Screen   Has the patient fallen in the past 6 months Yes   How many times? 1   Has the patient had a decrease in activity level because of a fear of falling?  Yes   Is the patient reluctant to leave their home because of a fear of falling?  No   Home Environment   Additional Comments sngle level with daughter in 3 level home - one step up at the door ~6 in     Prior Function   Level of Independence Independent with basic ADLs   Vocation Retired   Leisure light household chores; some gardening in the summer; otherwise sedentary    Posture/Postural Control   Posture Comments head forrward; shoulders rounded; flexed forward at hips; knees in Genu varus; Rt LE ER    AROM   Overall AROM Comments bilat LE ROM grossly WFL's except rotatioinand extension - hip extension is ~neutral bilaterally    Strength   Right/Left Hip --  assessed in supine ans sidelying unable to lie prone   Right Hip Flexion 3+/5   Right Hip Extension 3/5   Right Hip ABduction 3+/5   Right Hip ADduction 3/5   Left Hip Flexion 3+/5   Left Hip Extension 3/5   Left Hip ABduction 4-/5   Left Hip ADduction 3+/5   Right Knee Flexion 4/5   Right Knee Extension 4/5   Left Knee Flexion 4/5   Left Knee Extension 4/5   Ambulation/Gait   Gait Comments ambulates ti 4 point cane Lt UE; trunk and hips fwd flexed; knees flexed; LE's in ER; knees genu varsus; circumducts Lt LE with increased knee flexion in swing phase - Lt foot deviates laterally    Berg Balance Test   Sit to Stand Able to stand  independently using hands   Standing Unsupported Able to stand safely 2 minutes   Sitting with Back Unsupported but Feet Supported on Floor or Stool Able to sit safely and securely 2 minutes   Stand to Sit Controls descent by using hands   Transfers Able to transfer safely, definite need of hands   Standing Unsupported with Eyes Closed Able to stand 10 seconds safely   Standing Ubsupported with Feet Together Able to place feet together independently but unable to hold for 30 seconds   From Standing, Reach Forward with Outstretched Arm Can reach forward >12 cm safely (5")   From Standing Position, Pick up Object from Floor Able to pick up shoe safely and easily   From Standing Position, Turn to Look Behind Over each Shoulder Turn sideways only but maintains balance   Turn 360 Degrees Able to  turn 360 degrees safely but slowly   Standing Unsupported, Alternately Place Feet on Step/Stool Able to complete 4 steps without aid or supervision   Standing Unsupported, One Foot in [employer] balance while stepping or standing   Standing on One Leg Tries to lift leg/unable to hold 3 seconds but remains standing independently   Total Score 37   Merrilee Jansky  comment: pt at significant risk for falling - uses a 4 point cane                    [facility] Adult PT Treatment/Exercise - 04/07/15 0001    Knee/Hip Exercises: Standing   Other Standing Knee Exercises standing with back at wall push hips away from wall hold 10 sec 10 reps                 PT Education - 04/07/15 1441    Education provided Yes   Education Details HEP   Person(s) Educated Patient   Methods Explanation;Demonstration;Tactile cues;Verbal cues;Handout   Comprehension Verbalized understanding;Returned demonstration;Verbal cues required;Tactile cues required             PT Long Term Goals - 04/07/15 1728    PT LONG TERM GOAL #1   Title Improve strength bilat LE's 1/2 to 1 muscle grade 05/19/15   Time 6   Period Weeks   Status New   PT LONG TERM GOAL #2   Title Improve quality and safety of gait 05/19/15   Time 6   Period Weeks   Status New   PT LONG TERM GOAL #3   Title Improve Berg balance Scale score by 5-8 points 05/19/15   Time 6   Period Weeks   Status New   PT LONG TERM GOAL #4   Title I in HEP 05/19/15   Time 6   Period Weeks   Status New   PT LONG TERM GOAL #5   Title Pt demo ability to step up and down a curb with standby to contact supervisioin 05/19/15    Time 6   Period Weeks   Status New               Plan - 04/07/15 1716    Clinical Impression Statement Patient presents with difficulty walking and performing functional activities. She is fearful of falling. Clinically, patient has limited trunk and LE mobility; bilat LE weakness; poor posture and alignment; abnormal gait  pattern; incresaed risk of falling. Pt will benefit form PT to address problems identified.    Pt will benefit from skilled therapeutic intervention in order to improve on the following deficits Postural dysfunction;Improper body mechanics;Abnormal gait;Decreased range of motion;Decreased strength;Decreased mobility;Difficulty walking;Decreased activity tolerance;Decreased balance   Rehab Potential Good   PT Frequency 2x / week   PT Duration 6 weeks   PT Treatment/Interventions Patient/family education;ADLs/Self Care Home Management;Neuromuscular re-education;Therapeutic exercise;Therapeutic activities;Gait training;Manual techniques;Dry needling;Moist Heat;Ultrasound;Cryotherapy;Electrical Stimulation   PT Next Visit Plan LE strengthening; postural work; balance; gait training   PT Home Exercise Plan HEP   Consulted and Agree with Plan of Care Patient;Family member/caregiver   Family Member Consulted daughter [place]          Problem List Patient Active Problem List   Diagnosis Date Noted  . Cardiac pacemaker in situ 03/08/2015  . Cough 03/08/2015  . History of glaucoma 03/08/2015  . Palpitations 03/08/2015  . Cardiac risk counseling 03/08/2015  . History of tachycardia-bradycardia syndrome 03/08/2015  . Postmenopausal 03/08/2015  . Osteoporosis 03/08/2015  . Disorder of bone and cartilage 03/08/2015  . Hyperlipidemia 03/08/2015  . History of skin cancer 03/08/2015    Celyn Nilda Simmer  PT, MPH   04/07/2015, 5:34 PM  [facility] [place], [place], [address] Phone: [phone]   Fax:  [phone]  Name: Amanda Travis MRN: [id number] Date of Birth: 08-25-1928

## 2015-04-10 ENCOUNTER — Ambulatory Visit (INDEPENDENT_AMBULATORY_CARE_PROVIDER_SITE_OTHER): Payer: Medicare Other | Admitting: Physical Therapy

## 2015-04-10 DIAGNOSIS — R29898 Other symptoms and signs involving the musculoskeletal system: Secondary | ICD-10-CM | POA: Diagnosis not present

## 2015-04-10 DIAGNOSIS — R293 Abnormal posture: Secondary | ICD-10-CM | POA: Diagnosis present

## 2015-04-10 DIAGNOSIS — Z7409 Other reduced mobility: Secondary | ICD-10-CM | POA: Diagnosis not present

## 2015-04-10 DIAGNOSIS — R269 Unspecified abnormalities of gait and mobility: Secondary | ICD-10-CM

## 2015-04-10 DIAGNOSIS — R2681 Unsteadiness on feet: Secondary | ICD-10-CM

## 2015-04-10 NOTE — Therapy (Signed)
[place], [place], [address] Phone: [phone]   Fax:  [phone]  Physical Therapy Treatment  Patient Details  Name: Amanda Travis MRN: [id number] Date of Birth: 05-Feb-1929 Referring Provider: Dr. Emeterio Reeve  Encounter Date: 04/10/2015      PT End of Session - 04/10/15 1439    Visit Number 2   Number of Visits 12   Date for PT Re-Evaluation 05/19/15   PT Start Time 1435   PT Stop Time 1515   PT Time Calculation (min) 40 min   Activity Tolerance Patient tolerated treatment well      Past Medical History  Diagnosis Date  . Disorder of bone and cartilage 03/08/2015  . Osteoporosis 03/08/2015    previously on Fosamax, pt told to stop this due to dental procedure. Will repeat DEXA.    . Cardiac pacemaker in situ 03/08/2015    Placed for Tachy/Brady syndrome   . Cardiac risk counseling 03/08/2015    Await lipids ot calculate ASCVD risk score. No hx known MI/CVA   . Cough 03/08/2015  . History of glaucoma 03/08/2015  . History of skin cancer 03/08/2015  . History of tachycardia-bradycardia syndrome 03/08/2015  . Hyperlipidemia 03/08/2015  . Palpitations 03/08/2015    Pt concerned for Afib.CHA2DDS2-VASc Score 3.2%. Pt reports pacemaker check revealed AFib but I don't see Afib on PM reports, EKG sinus   . Postmenopausal 03/08/2015    Past Surgical History  Procedure Laterality Date  . Appendectomy    . Eye surgery      CATARACT  . Ovarian cancer  1997  . Joint replacement Bilateral     KNEE  . Cholecystectomy    . Mohs surgery  2015  . Tooth extraction    . Blood clot  2006  . Colon surgery  1998  . Abdominal hysterectomy  1979    There were no vitals filed for this visit.  Visit Diagnosis:  Abnormal posture  Abnormal gait  Weakness of both lower extremities  Unsteadiness  Impaired functional mobility, balance, and endurance      Subjective Assessment - 04/10/15 1441    Subjective Pt did some walking in [place] yesterday with RW; felt tired and sore in arms and upper back and back of legs.    Currently in Pain? Yes   Pain Score 4    Pain Location Back   Pain Orientation Lower   Pain Descriptors / Indicators Dull   Aggravating Factors  prolonged walking   Pain Relieving Factors ice             [facility] PT Assessment - 04/10/15 0001    Assessment   Medical Diagnosis abnormal gait pattern decreased safety    Referring Provider Dr. Emeterio Reeve   Onset Date/Surgical Date 09/19/14   Hand Dominance Right   Next MD Visit 6/17   Prior Therapy none for current problems            [facility] Adult PT Treatment/Exercise - 04/10/15 0001    Knee/Hip Exercises: Stretches   Gastroc Stretch 2 reps;30 seconds;Right;Left   Knee/Hip Exercises: Standing   Heel Raises Both;1 set;10 reps  with toe raises   Hip Flexion 1 set;10 reps  marching    Hip Abduction Stengthening;Right;Left;2 sets;5 reps;Knee straight   Hip Extension Right;Left;1 set;10 reps;Knee straight   Other Standing Knee Exercises tandem stance Rt/Lt x 15-30 sec x 2 trials each; narrow BOS x 25 sec.     Other  Standing Knee Exercises Standing hip/trunk ext (with back against wall x 5 reps with 10 sec hold.   side stepping with increased side step x 10 ft R/L x 2 reps; forward marching x 10 feet    Knee/Hip Exercises: Seated   Long Arc Quad Right;Left;2 sets;5 reps  5 sec hold in knee ext   Marching Limitations x 10 x 2 sets                     PT Long Term Goals - 04/07/15 1728    PT LONG TERM GOAL #1   Title Improve strength bilat LE's 1/2 to 1 muscle grade 05/19/15   Time 6   Period Weeks   Status New   PT LONG TERM GOAL #2   Title Improve quality and safety of gait 05/19/15   Time 6   Period Weeks   Status New   PT LONG TERM GOAL #3   Title Improve Berg balance Scale score by 5-8 points 05/19/15   Time 6   Period Weeks   Status New   PT LONG TERM GOAL #4   Title I in  HEP 05/19/15   Time 6   Period Weeks   Status New   PT LONG TERM GOAL #5   Title Pt demo ability to step up and down a curb with standby to contact [place] 05/19/15    Time 6   Period Weeks   Status New               Plan - 04/10/15 1516    Clinical Impression Statement Pt tolerated new exercises well, 5 rep sets. Pt reported her LBP had resolved by end of session.  Pt slowly progressing towards goals.    Pt will benefit from skilled therapeutic intervention in order to improve on the following deficits Postural dysfunction;Improper body mechanics;Abnormal gait;Decreased range of motion;Decreased strength;Decreased mobility;Difficulty walking;Decreased activity tolerance;Decreased balance   Rehab Potential Good   PT Frequency 2x / week   PT Duration 6 weeks   PT Treatment/Interventions Patient/family education;ADLs/Self Care Home Management;Neuromuscular re-education;Therapeutic exercise;Therapeutic activities;Gait training;Manual techniques;Dry needling;Moist Heat;Ultrasound;Cryotherapy;Electrical Stimulation   PT Next Visit Plan LE strengthening; postural work; balance; gait training   Consulted and Agree with Plan of Care Patient        Problem List Patient Active Problem List   Diagnosis Date Noted  . Cardiac pacemaker in situ 03/08/2015  . Cough 03/08/2015  . History of glaucoma 03/08/2015  . Palpitations 03/08/2015  . Cardiac risk counseling 03/08/2015  . History of tachycardia-bradycardia syndrome 03/08/2015  . Postmenopausal 03/08/2015  . Osteoporosis 03/08/2015  . Disorder of bone and cartilage 03/08/2015  . Hyperlipidemia 03/08/2015  . History of skin cancer 03/08/2015   Kerin Perna, PTA 04/10/2015 3:18 PM  [place], [place], [address] Phone: [phone]   Fax:  [phone]  Name: Amanda Travis MRN: [id number] Date of Birth: 1928-10-30

## 2015-04-10 NOTE — Patient Instructions (Signed)
Knee High   Holding stable object, raise knee to hip level, then lower knee. Repeat with other knee. Complete __10_ repetitions. Do __2__ sets per session.  ABDUCTION: Standing (Active)   Stand, feet flat. Lift right leg out to side. Use _0__ lbs. Complete __10_ repetitions. Perform __2_ sets per session.  EXTENSION: Standing (Active)  Stand, both feet flat. Draw right leg behind body as far as possible. Use 0___ lbs. Complete 10 repetitions. Perform __2_ sessions per day.  Toe / Heel Raise    Gently rock back on heels and raise toes. Then rock forward on toes and raise heels. Repeat sequence _10___ times per session. Do _1___ sessions per  Day.   Calf Stretch    Place hands on wall at shoulder height. Keeping back leg straight, bend front leg, feet pointing forward, heels flat on floor. Lean forward slightly until stretch is felt in calf of back leg. Hold stretch __15-30_ seconds, breathing slowly in and out. Repeat stretch with other leg back. Do _1-2__ sessions per day. Variation: Use chair or table for support.   KNEE: Extension, Long Arc Quads - Sitting    Raise leg until knee is straight. Hold 5 sec.  _5-10__ reps per set, __1-2_ sets per day, _5 __ days per week  Copyright  VHI. All rights reserved.    [place] [facility], [place] [phone] (office) [phone] (fax)

## 2015-04-13 ENCOUNTER — Ambulatory Visit (INDEPENDENT_AMBULATORY_CARE_PROVIDER_SITE_OTHER): Payer: Medicare Other | Admitting: Physical Therapy

## 2015-04-13 DIAGNOSIS — R29898 Other symptoms and signs involving the musculoskeletal system: Secondary | ICD-10-CM

## 2015-04-13 DIAGNOSIS — R293 Abnormal posture: Secondary | ICD-10-CM | POA: Diagnosis present

## 2015-04-13 DIAGNOSIS — R269 Unspecified abnormalities of gait and mobility: Secondary | ICD-10-CM | POA: Diagnosis not present

## 2015-04-13 DIAGNOSIS — R2681 Unsteadiness on feet: Secondary | ICD-10-CM | POA: Diagnosis not present

## 2015-04-13 DIAGNOSIS — Z7409 Other reduced mobility: Secondary | ICD-10-CM

## 2015-04-13 NOTE — Therapy (Signed)
[place], [place], [address] Phone: [phone]   Fax:  [phone]  Physical Therapy Treatment  Patient Details  Name: Amanda Travis MRN: [id number] Date of Birth: September 17, 1928 Referring Provider: Dr. Emeterio Reeve   Encounter Date: 04/13/2015      PT End of Session - 04/13/15 1455    Visit Number 3   Number of Visits 12   Date for PT Re-Evaluation 05/19/15   PT Start Time [id number]   PT Stop Time 1550   PT Time Calculation (min) 61 min   Activity Tolerance Patient tolerated treatment well      Past Medical History  Diagnosis Date  . Disorder of bone and cartilage 03/08/2015  . Osteoporosis 03/08/2015    previously on Fosamax, pt told to stop this due to dental procedure. Will repeat DEXA.    . Cardiac pacemaker in situ 03/08/2015    Placed for Tachy/Brady syndrome   . Cardiac risk counseling 03/08/2015    Await lipids ot calculate ASCVD risk score. No hx known MI/CVA   . Cough 03/08/2015  . History of glaucoma 03/08/2015  . History of skin cancer 03/08/2015  . History of tachycardia-bradycardia syndrome 03/08/2015  . Hyperlipidemia 03/08/2015  . Palpitations 03/08/2015    Pt concerned for Afib.CHA2DDS2-VASc Score 3.2%. Pt reports pacemaker check revealed AFib but I don't see Afib on PM reports, EKG sinus   . Postmenopausal 03/08/2015    Past Surgical History  Procedure Laterality Date  . Appendectomy    . Eye surgery      CATARACT  . Ovarian cancer  1997  . Joint replacement Bilateral     KNEE  . Cholecystectomy    . Mohs surgery  2015  . Tooth extraction    . Blood clot  2006  . Colon surgery  1998  . Abdominal hysterectomy  1979    There were no vitals filed for this visit.  Visit Diagnosis:  Abnormal posture  Abnormal gait  Weakness of both lower extremities  Unsteadiness  Impaired functional mobility, balance, and endurance      Subjective Assessment - 04/13/15 1458    Subjective Pt reports she thinks she overdid it 2 days ago with exercise.  Some exercises are getting easier.    Currently in Pain? No/denies            [facility] PT Assessment - 04/13/15 0001    Assessment   Medical Diagnosis abnormal gait pattern decreased safety    Referring Provider Dr. Emeterio Reeve    Onset Date/Surgical Date 09/19/14   Hand Dominance Right   Next MD Visit 6/17   Prior Therapy none for current problems    Strength   Right Hip Flexion 4+/5   Right Hip Extension 3-/5   Right Hip ABduction 4-/5   Left Hip Flexion 4+/5   Left Hip Extension 3-/5   Left Hip ABduction 3/5                     [facility] Adult PT Treatment/Exercise - 04/13/15 0001    Knee/Hip Exercises: Stretches   Gastroc Stretch 2 reps;30 seconds;Right;Left   Gastroc Stretch Limitations pt reported limited calf stretch, mostly hip flexor / quad    Knee/Hip Exercises: Aerobic   Nustep L3: 5.5 min    Knee/Hip Exercises: Standing   Other Standing Knee Exercises marching on blue pad with light UE support    Other Standing Knee Exercises Standing hip/trunk ext (with  back against wall x 5 reps with 10 sec hold.   side stepping with increased side step x 10 ft R/L x 2 reps; forward marching x 10 feet    Knee/Hip Exercises: Seated   Long Arc Quad Left;Right;2 sets  1 set of 10, 2nd set of 5   Clamshell with TheraBand Red  x 10 reps, 2 sets (challenging)   Marching Limitations x 10 x 2 sets   Marching Weights 2 lbs.   Knee/Hip Exercises: Supine   Straight Leg Raises Right;Left;1 set;10 reps   Knee/Hip Exercises: Sidelying   Hip ABduction Left;Right;Strengthening;1 set;5 reps   Knee/Hip Exercises: Prone   Hip Extension Strengthening;Right;Left;1 set;5 reps   Modalities   Modalities Moist Heat   Moist Heat Therapy   Number Minutes Moist Heat 12 Minutes   Moist Heat Location Other (comment)  bilat quads to knees                PT Education - 04/13/15 1554    Education  provided Yes   Education Details HEP - pt issued yellow band for seated clams    Person(s) Educated Patient   Methods Explanation   Comprehension Verbalized understanding             PT Long Term Goals - 04/07/15 1728    PT LONG TERM GOAL #1   Title Improve strength bilat LE's 1/2 to 1 muscle grade 05/19/15   Time 6   Period Weeks   Status New   PT LONG TERM GOAL #2   Title Improve quality and safety of gait 05/19/15   Time 6   Period Weeks   Status New   PT LONG TERM GOAL #3   Title Improve Berg balance Scale score by 5-8 points 05/19/15   Time 6   Period Weeks   Status New   PT LONG TERM GOAL #4   Title I in HEP 05/19/15   Time 6   Period Weeks   Status New   PT LONG TERM GOAL #5   Title Pt demo ability to step up and down a curb with standby to contact [place] 05/19/15    Time 6   Period Weeks   Status New               Plan - 04/13/15 1548    Clinical Impression Statement Pt demonstrated improved hip flexion, however remains weak in hip ext and abduction. Pt reported some thigh discomfort with LE exercises, reduced with use of MHP at end of session.  Progressing gradually towards goals.,    Pt will benefit from skilled therapeutic intervention in order to improve on the following deficits Postural dysfunction;Improper body mechanics;Abnormal gait;Decreased range of motion;Decreased strength;Decreased mobility;Difficulty walking;Decreased activity tolerance;Decreased balance   Rehab Potential Good   PT Frequency 2x / week   PT Duration 6 weeks   PT Treatment/Interventions Patient/family education;ADLs/Self Care Home Management;Neuromuscular re-education;Therapeutic exercise;Therapeutic activities;Gait training;Manual techniques;Dry needling;Moist Heat;Ultrasound;Cryotherapy;Electrical Stimulation   PT Next Visit Plan LE strengthening; postural work; balance; gait training   Consulted and Agree with Plan of Care Patient        Problem List Patient  Active Problem List   Diagnosis Date Noted  . Cardiac pacemaker in situ 03/08/2015  . Cough 03/08/2015  . History of glaucoma 03/08/2015  . Palpitations 03/08/2015  . Cardiac risk counseling 03/08/2015  . History of tachycardia-bradycardia syndrome 03/08/2015  . Postmenopausal 03/08/2015  . Osteoporosis 03/08/2015  . Disorder of bone and cartilage 03/08/2015  .  Hyperlipidemia 03/08/2015  . History of skin cancer 03/08/2015    Kerin Perna, PTA 04/13/2015 4:05 PM  [facility] [place], [place], [address] Phone: [phone]   Fax:  [phone]  Name: Alitzah Henkelman MRN: [id number] Date of Birth: 1929-02-04

## 2015-04-13 NOTE — Patient Instructions (Signed)
ABDUCTION: Sitting - Resistance Band (Active)    Sit with feet flat. Lift right leg slightly and, against red resistance band, draw it out to side. (Feet stay together, knees go out) Complete _1-2__ sets of _10__ repetitions. Perform _1__ sessions per day. Gluteal Sets    Tighten buttocks while pressing pelvis to floor. Hold __5__ seconds. Repeat _10___ times per set. Do _1-2___ sets per session. Do _1-2___ sessions per day.  http://[email or web address].exer.us/105    [facility] [place]  [place], [place] [address]  [phone] (office) [phone] (fax)

## 2015-04-17 ENCOUNTER — Ambulatory Visit (INDEPENDENT_AMBULATORY_CARE_PROVIDER_SITE_OTHER): Payer: Medicare Other | Admitting: Physical Therapy

## 2015-04-17 ENCOUNTER — Telehealth: Payer: Self-pay | Admitting: Osteopathic Medicine

## 2015-04-17 DIAGNOSIS — Z7409 Other reduced mobility: Secondary | ICD-10-CM | POA: Diagnosis not present

## 2015-04-17 DIAGNOSIS — R29898 Other symptoms and signs involving the musculoskeletal system: Secondary | ICD-10-CM | POA: Diagnosis not present

## 2015-04-17 DIAGNOSIS — R2681 Unsteadiness on feet: Secondary | ICD-10-CM | POA: Diagnosis not present

## 2015-04-17 DIAGNOSIS — Z45018 Encounter for adjustment and management of other part of cardiac pacemaker: Secondary | ICD-10-CM | POA: Diagnosis not present

## 2015-04-17 DIAGNOSIS — R269 Unspecified abnormalities of gait and mobility: Secondary | ICD-10-CM | POA: Diagnosis not present

## 2015-04-17 DIAGNOSIS — R293 Abnormal posture: Secondary | ICD-10-CM

## 2015-04-17 DIAGNOSIS — I495 Sick sinus syndrome: Secondary | ICD-10-CM | POA: Diagnosis not present

## 2015-04-17 NOTE — Therapy (Signed)
[place], [place], [address] Phone: [phone]   Fax:  [phone]  Physical Therapy Treatment  Patient Details  Name: Amanda Travis MRN: [id number] Date of Birth: [birth year] Referring Provider: Dr. Emeterio Reeve   Encounter Date: 04/17/2015      PT End of Session - 04/17/15 0805    Visit Number 4   Number of Visits 12   Date for PT Re-Evaluation 05/19/15   PT Start Time 0802   PT Stop Time [id number]   PT Time Calculation (min) 40 min   Activity Tolerance Patient tolerated treatment well;No increased pain      Past Medical History  Diagnosis Date  . Disorder of bone and cartilage 03/08/2015  . Osteoporosis 03/08/2015    previously on Fosamax, pt told to stop this due to dental procedure. Will repeat DEXA.    . Cardiac pacemaker in situ 03/08/2015    Placed for Tachy/Brady syndrome   . Cardiac risk counseling 03/08/2015    Await lipids ot calculate ASCVD risk score. No hx known MI/CVA   . Cough 03/08/2015  . History of glaucoma 03/08/2015  . History of skin cancer 03/08/2015  . History of tachycardia-bradycardia syndrome 03/08/2015  . Hyperlipidemia 03/08/2015  . Palpitations 03/08/2015    Pt concerned for Afib.CHA2DDS2-VASc Score 3.2%. Pt reports pacemaker check revealed AFib but I don't see Afib on PM reports, EKG sinus   . Postmenopausal 03/08/2015    Past Surgical History  Procedure Laterality Date  . Appendectomy    . Eye surgery      CATARACT  . Ovarian cancer  1997  . Joint replacement Bilateral     KNEE  . Cholecystectomy    . Mohs surgery  2015  . Tooth extraction    . Blood clot  2006  . Colon surgery  1998  . Abdominal hysterectomy  1979    There were no vitals filed for this visit.  Visit Diagnosis:  Abnormal posture  Abnormal gait  Weakness of both lower extremities  Unsteadiness  Impaired functional mobility, balance, and endurance      Subjective Assessment -  04/17/15 0806    Subjective Pt reports it was a busy weekend.  Had some pain this morning, but used massager and pain subsided.    Currently in Pain? No/denies            [facility] PT Assessment - 04/17/15 0001    Assessment   Medical Diagnosis abnormal gait pattern decreased safety    Referring Provider Dr. Emeterio Reeve    Onset Date/Surgical Date 09/19/14   Hand Dominance Right   Next MD Visit 6/17   Prior Therapy none for current problems            [facility] Adult PT Treatment/Exercise - 04/17/15 0001    Ambulation/Gait   Ambulation Distance (Feet) 80 Feet   Gait Pattern Step-through pattern;Decreased stride length;Right flexed knee in stance   Gait Comments VC for SPC to be same distance as Lt leg    Knee/Hip Exercises: Stretches   Press photographer 2 reps;30 seconds;Right;Left   Gastroc Stretch Limitations pt reported limited calf stretch, mostly hip flexor / quad    Knee/Hip Exercises: Aerobic   Nustep L4: 5 min    Knee/Hip Exercises: Standing   Heel Raises Both;1 set;15 reps  with toe raises   Forward Step Up Right;1 set;15 reps;Step Height: 2"  SPC in Rt hand and CGA   Step Down Right;Left;Step  Height: 2"  6 steps with SPC and CGA   SLS 3 trials each leg with moderate UE support x 3 sec.     Other Standing Knee Exercises mini squats with UE support, x 10; Standing marching with high knees x 10 reps    Other Standing Knee Exercises Tandem stance x 15-20 sec each side (unable to take position without UE support)    Knee/Hip Exercises: Seated   Long Arc Quad Strengthening;Both;2 sets;10 reps;Weights   Long Arc Quad Weight --  2# 1st set, 3# 2nd set   Marching Limitations x 10 x 2 sets   Marching Weights 3 lbs.           PT Long Term Goals - 04/17/15 1017    PT LONG TERM GOAL #1   Title Improve strength bilat LE's 1/2 to 1 muscle grade 05/19/15   Time 6   Period Weeks   Status On-going   PT LONG TERM GOAL #2   Title Improve quality and safety of gait  05/19/15   Time 6   Period Weeks   Status On-going   PT LONG TERM GOAL #3   Title Improve Berg balance Scale score by 5-8 points 05/19/15   Time 6   Period Weeks   Status On-going   PT LONG TERM GOAL #4   Title I in HEP 05/19/15   Time 6   Period Weeks   PT LONG TERM GOAL #5   Title Pt demo ability to step up and down a curb with standby to contact [place] 05/19/15    Time 6   Period Weeks   Status On-going               Plan - 04/17/15 1202    Clinical Impression Statement Pt tolerated increased resistance with LAQ and is able to lift knees higher with marching than previously.  Pt continues with decreased balance with steps and attempts at SLS.  Progressing towards goals .   Pt will benefit from skilled therapeutic intervention in order to improve on the following deficits Postural dysfunction;Improper body mechanics;Abnormal gait;Decreased range of motion;Decreased strength;Decreased mobility;Difficulty walking;Decreased activity tolerance;Decreased balance   Rehab Potential Good   PT Frequency 2x / week   PT Duration 6 weeks   PT Treatment/Interventions Patient/family education;ADLs/Self Care Home Management;Neuromuscular re-education;Therapeutic exercise;Therapeutic activities;Gait training;Manual techniques;Dry needling;Moist Heat;Ultrasound;Cryotherapy;Electrical Stimulation   PT Next Visit Plan LE strengthening; postural work; balance; gait training   Consulted and Agree with Plan of Care Patient        Problem List Patient Active Problem List   Diagnosis Date Noted  . Cardiac pacemaker in situ 03/08/2015  . Cough 03/08/2015  . History of glaucoma 03/08/2015  . Palpitations 03/08/2015  . Cardiac risk counseling 03/08/2015  . History of tachycardia-bradycardia syndrome 03/08/2015  . Postmenopausal 03/08/2015  . Osteoporosis 03/08/2015  . Disorder of bone and cartilage 03/08/2015  . Hyperlipidemia 03/08/2015  . History of skin cancer 03/08/2015     Kerin Perna, PTA 04/17/2015 12:07 PM  [place], [place], [address] Phone: [phone]   Fax:  [phone]  Name: Amanda Travis MRN: [id number] Date of Birth: Jan 17, 1929

## 2015-04-17 NOTE — Telephone Encounter (Signed)
Pt daughter came in and wanted to know if Dr. Sheppard Coil had spoken with the oral surgeon to determine when her mother can go back on a bone density treatment plan and which treatment was best because they were considering go to one of the shots. Thanks

## 2015-04-17 NOTE — Telephone Encounter (Signed)
Dr. Sheppard Coil please see note below. Rhonda Cunningham,CMA

## 2015-04-18 NOTE — Telephone Encounter (Signed)
Per Pt daughter, Pt would like to see if Prolia is approved. Will contact the insurance company.  Pt also saw her cardiologist yesterday and she does have a-fib even with the pacemaker. They are adding Pt on eliquis. Will route to PCP.

## 2015-04-18 NOTE — Telephone Encounter (Signed)
Per dental notes, she is cleared to go back on Fosamax or other treatment for osteoporosis. If she would like to do the Prolia, we can order that. Thanks.

## 2015-04-19 ENCOUNTER — Ambulatory Visit (INDEPENDENT_AMBULATORY_CARE_PROVIDER_SITE_OTHER): Payer: Medicare Other | Admitting: Physical Therapy

## 2015-04-19 ENCOUNTER — Encounter: Payer: Self-pay | Admitting: Osteopathic Medicine

## 2015-04-19 DIAGNOSIS — R2681 Unsteadiness on feet: Secondary | ICD-10-CM | POA: Diagnosis not present

## 2015-04-19 DIAGNOSIS — R293 Abnormal posture: Secondary | ICD-10-CM

## 2015-04-19 DIAGNOSIS — Z7409 Other reduced mobility: Secondary | ICD-10-CM

## 2015-04-19 DIAGNOSIS — R269 Unspecified abnormalities of gait and mobility: Secondary | ICD-10-CM | POA: Diagnosis not present

## 2015-04-19 DIAGNOSIS — R29898 Other symptoms and signs involving the musculoskeletal system: Secondary | ICD-10-CM

## 2015-04-19 HISTORY — DX: Unsteadiness on feet: R26.81

## 2015-04-19 NOTE — Telephone Encounter (Signed)
Pt has Medicare and BCBS supplementary, this will cover Prolia. Spoke with PCP, if Pt (or family) do not have any further questions regarding injections (was discussed at last office visit) she can be added onto the nurse schedule for new start. Daughter states there are no further questions, Pt was added on to nurse schedule this week.

## 2015-04-19 NOTE — Therapy (Signed)
[place], [place], [address] Phone: [phone]   Fax:  [phone]  Physical Therapy Treatment  Patient Details  Name: Amanda Travis MRN: [id number] Date of Birth: 07/07/1928 Referring Provider: Dr. Emeterio Reeve   Encounter Date: 04/19/2015      PT End of Session - 04/19/15 1107    Visit Number 5   Number of Visits 12   Date for PT Re-Evaluation 05/19/15   PT Start Time 1105   PT Stop Time 1145   PT Time Calculation (min) 40 min   Activity Tolerance Patient tolerated treatment well;No increased pain      Past Medical History  Diagnosis Date  . Disorder of bone and cartilage 03/08/2015  . Osteoporosis 03/08/2015    previously on Fosamax, pt told to stop this due to dental procedure. Will repeat DEXA.    . Cardiac pacemaker in situ 03/08/2015    Placed for Tachy/Brady syndrome   . Cardiac risk counseling 03/08/2015    Await lipids ot calculate ASCVD risk score. No hx known MI/CVA   . Cough 03/08/2015  . History of glaucoma 03/08/2015  . History of skin cancer 03/08/2015  . History of tachycardia-bradycardia syndrome 03/08/2015  . Hyperlipidemia 03/08/2015  . Palpitations 03/08/2015    Pt concerned for Afib.CHA2DDS2-VASc Score 3.2%. Pt reports pacemaker check revealed AFib but I don't see Afib on PM reports, EKG sinus   . Postmenopausal 03/08/2015  . Gait instability 04/19/2015    Past Surgical History  Procedure Laterality Date  . Appendectomy    . Eye surgery      CATARACT  . Ovarian cancer  1997  . Joint replacement Bilateral     KNEE  . Cholecystectomy    . Mohs surgery  2015  . Tooth extraction    . Blood clot  2006  . Colon surgery  1998  . Abdominal hysterectomy  1979    There were no vitals filed for this visit.  Visit Diagnosis:  Abnormal posture  Abnormal gait  Weakness of both lower extremities  Unsteadiness  Impaired functional mobility, balance, and endurance      Subjective Assessment - 04/19/15 1109    Subjective Pt reports she feels like she is getting better, more steady. She is walking without cane more when dogs not around.  Feels she is standing straighter. She notes 30% improvement.    Currently in Pain? No/denies            [facility] PT Assessment - 04/19/15 0001    Assessment   Medical Diagnosis abnormal gait pattern decreased safety    Referring Provider Dr. Emeterio Reeve    Onset Date/Surgical Date 09/19/14   Hand Dominance Right   Next MD Visit 6/17   Prior Therapy none for current problems           [facility] Adult PT Treatment/Exercise - 04/19/15 0001    Knee/Hip Exercises: Stretches   Quad Stretch Left;Right;2 reps;30 seconds  seated with foot under seat.    Gastroc Stretch 2 reps;Right;Left;60 seconds   Knee/Hip Exercises: Aerobic   Nustep L4: 5 min    Knee/Hip Exercises: Standing   Lateral Step Up Left;2 sets;5 reps;Hand Hold: 2;Step Height: 6"   Forward Step Up Left;2 sets;5 reps  Rt hand on SPC   Stairs 5 stairs with SPC in Rt hand x 3 reps - VC for safety and sequence   SLS 3 trials each leg with Light to moderate UE support  x 3 sec.     Other Standing Knee Exercises On 3" blue foam:  marching in place; narrow BOS without UE support x 20 sec x 2 trials.    Other Standing Knee Exercises Rt/Lt step forward/backward over 1/2 foam roll x 5 reps each side (required UE support with Lt SLS)    Knee/Hip Exercises: Seated   Long Arc Quad Strengthening;Both;2 sets;10 reps;Weights   Long Arc Quad Weight 4 lbs.   Marching Limitations x 10 x 2 sets   Marching Weights 4 lbs.   Modalities   Modalities --  pt declined; will ice at home            PT Long Term Goals - 04/19/15 1309    PT LONG TERM GOAL #1   Title Improve strength bilat LE's 1/2 to 1 muscle grade 05/19/15   Time 6   Period Weeks   Status On-going   PT LONG TERM GOAL #2   Title Improve quality and safety of gait 05/19/15   Time 6   Period Weeks    Status On-going   PT LONG TERM GOAL #3   Title Improve Berg balance Scale score by 5-8 points 05/19/15   Time 6   Period Weeks   Status On-going   PT LONG TERM GOAL #4   Title I in HEP 05/19/15   Time 6   Period Weeks   Status On-going   PT LONG TERM GOAL #5   Title Pt demo ability to step up and down a curb with standby to contact supervisioin 05/19/15    Time 6   Period Weeks   Status Achieved               Plan - 04/19/15 1259    Clinical Impression Statement Pt able to tolerate increased resistance with LAQ and marching without difficulty.  Pt ascended/descended stairs with cane and no other UE support and close supervision (no LOB); has met LTG #5.  Pt progressing towards remaining goals.    Pt will benefit from skilled therapeutic intervention in order to improve on the following deficits Postural dysfunction;Improper body mechanics;Abnormal gait;Decreased range of motion;Decreased strength;Decreased mobility;Difficulty walking;Decreased activity tolerance;Decreased balance   Rehab Potential Good   PT Frequency 2x / week   PT Duration 6 weeks   PT Treatment/Interventions Patient/family education;ADLs/Self Care Home Management;Neuromuscular re-education;Therapeutic exercise;Therapeutic activities;Gait training;Manual techniques;Dry needling;Moist Heat;Ultrasound;Cryotherapy;Electrical Stimulation   PT Next Visit Plan assess Berg and/or leg strength.  Add UE posture exercises, to HEP if tolerated.    Consulted and Agree with Plan of Care Patient        Problem List Patient Active Problem List   Diagnosis Date Noted  . Gait instability 04/19/2015  . Cardiac pacemaker in situ 03/08/2015  . Cough 03/08/2015  . History of glaucoma 03/08/2015  . Palpitations 03/08/2015  . Cardiac risk counseling 03/08/2015  . History of tachycardia-bradycardia syndrome 03/08/2015  . Postmenopausal 03/08/2015  . Osteoporosis 03/08/2015  . Disorder of bone and cartilage 03/08/2015  .  Hyperlipidemia 03/08/2015  . History of skin cancer 03/08/2015   Kerin Perna, PTA 04/19/2015 1:10 PM  [place], [place], [address] Phone: [phone]   Fax:  [phone]  Name: Amanda Travis MRN: [id number] Date of Birth: September 16, 1928

## 2015-04-21 ENCOUNTER — Ambulatory Visit (INDEPENDENT_AMBULATORY_CARE_PROVIDER_SITE_OTHER): Payer: Medicare Other | Admitting: Osteopathic Medicine

## 2015-04-21 VITALS — BP 148/84 | HR 71

## 2015-04-21 DIAGNOSIS — M810 Age-related osteoporosis without current pathological fracture: Secondary | ICD-10-CM

## 2015-04-21 MED ORDER — DENOSUMAB 60 MG/ML ~~LOC~~ SOLN
60.0000 mg | Freq: Once | SUBCUTANEOUS | Status: AC
  Administered 2015-04-21: 60 mg via SUBCUTANEOUS

## 2015-04-21 NOTE — Progress Notes (Signed)
Reviewed nurse notes, nothing to add

## 2015-04-21 NOTE — Progress Notes (Signed)
   Subjective:    Patient ID: Amanda Travis, female    DOB: Oct 03, 1928, 82 y.o.   MRN: [id number]  HPI  Amanda Travis is here for a Prolia injection. She is taking calcium and vitamin D daily. Her last CMP had a normal calcium.   Review of Systems     Objective:   Physical Exam        Assessment & Plan:  Patient tolerated injection well without complications. Patient advised to schedule next injection 6 months from today.

## 2015-04-24 ENCOUNTER — Ambulatory Visit (INDEPENDENT_AMBULATORY_CARE_PROVIDER_SITE_OTHER): Payer: Medicare Other | Admitting: Physical Therapy

## 2015-04-24 DIAGNOSIS — R2681 Unsteadiness on feet: Secondary | ICD-10-CM | POA: Diagnosis not present

## 2015-04-24 DIAGNOSIS — R269 Unspecified abnormalities of gait and mobility: Secondary | ICD-10-CM

## 2015-04-24 DIAGNOSIS — R293 Abnormal posture: Secondary | ICD-10-CM

## 2015-04-24 DIAGNOSIS — Z7409 Other reduced mobility: Secondary | ICD-10-CM

## 2015-04-24 DIAGNOSIS — R29898 Other symptoms and signs involving the musculoskeletal system: Secondary | ICD-10-CM | POA: Diagnosis not present

## 2015-04-24 NOTE — Therapy (Signed)
[place], [place], [address] Phone: [phone]   Fax:  [phone]  Physical Therapy Treatment  Patient Details  Name: Amanda Travis MRN: [id number] Date of Birth: [birth year] Referring Provider: Dr. Emeterio Reeve   Encounter Date: 04/24/2015      PT End of Session - 04/24/15 1409    Visit Number 6   Number of Visits 12   Date for PT Re-Evaluation 05/19/15   PT Start Time [id number]   PT Stop Time 1438   PT Time Calculation (min) 33 min   Activity Tolerance Patient tolerated treatment well      Past Medical History  Diagnosis Date  . Disorder of bone and cartilage 03/08/2015  . Osteoporosis 03/08/2015    previously on Fosamax, pt told to stop this due to dental procedure. Will repeat DEXA.    . Cardiac pacemaker in situ 03/08/2015    Placed for Tachy/Brady syndrome   . Cardiac risk counseling 03/08/2015    Await lipids ot calculate ASCVD risk score. No hx known MI/CVA   . Cough 03/08/2015  . History of glaucoma 03/08/2015  . History of skin cancer 03/08/2015  . History of tachycardia-bradycardia syndrome 03/08/2015  . Hyperlipidemia 03/08/2015  . Palpitations 03/08/2015    Pt concerned for Afib.CHA2DDS2-VASc Score 3.2%. Pt reports pacemaker check revealed AFib but I don't see Afib on PM reports, EKG sinus   . Postmenopausal 03/08/2015  . Gait instability 04/19/2015    Past Surgical History  Procedure Laterality Date  . Appendectomy    . Eye surgery      CATARACT  . Ovarian cancer  1997  . Joint replacement Bilateral     KNEE  . Cholecystectomy    . Mohs surgery  2015  . Tooth extraction    . Blood clot  2006  . Colon surgery  1998  . Abdominal hysterectomy  1979    There were no vitals filed for this visit.  Visit Diagnosis:  Abnormal posture  Abnormal gait  Weakness of both lower extremities  Unsteadiness  Impaired functional mobility, balance, and endurance      Subjective  Assessment - 04/24/15 1408    Subjective Pt reports she was very sore this morning in knees and thighs, but massaged the area and placed heat on it and the pain subsided.  No new changes otherwise    Currently in Pain? No/denies          [facility] Adult PT Treatment/Exercise - 04/24/15 0001    Knee/Hip Exercises: Stretches   Passive Hamstring Stretch Right;Left;2 reps;30 seconds   Quad Stretch Left;Right;2 reps;30 seconds  seated with foot under seat.    Gastroc Stretch Right;Left;60 seconds;3 reps   Press photographer Limitations pt reported limited calf stretch, mostly hip flexor / quad    Knee/Hip Exercises: Standing   Heel Raises 2 sets;10 reps;Both  on 1/2 foam roll with toe raises, UE support    Other Standing Knee Exercises side stepping on 1/2 foam roll x 2 reps Rt/Lt    Other Standing Knee Exercises standing regular stance with medium perterbations in all directions; repeated with semi-tandem stance Rt/ Lt.  Marching on blue pad with VC for increased step height (challenging) x 15 reps x 2 sets    Knee/Hip Exercises: Seated   Long Arc Quad Strengthening;Right;Left;2 sets;10 reps;Weights   Long Arc Quad Weight 4 lbs.   Marching Limitations x 10 x 2 sets   Marching Weights 4 lbs.  PT Long Term Goals - 04/19/15 1309    PT LONG TERM GOAL #1   Title Improve strength bilat LE's 1/2 to 1 muscle grade 05/19/15   Time 6   Period Weeks   Status On-going   PT LONG TERM GOAL #2   Title Improve quality and safety of gait 05/19/15   Time 6   Period Weeks   Status On-going   PT LONG TERM GOAL #3   Title Improve Berg balance Scale score by 5-8 points 05/19/15   Time 6   Period Weeks   Status On-going   PT LONG TERM GOAL #4   Title I in HEP 05/19/15   Time 6   Period Weeks   Status On-going   PT LONG TERM GOAL #5   Title Pt demo ability to step up and down a curb with standby to contact [place] 05/19/15    Time 6   Period Weeks   Status Achieved                Plan - 04/24/15 1438    Clinical Impression Statement Pt tolerated exercises without increase in pain.  No LOB with standing balance activities.  Progressing towards remaining goals.    Pt will benefit from skilled therapeutic intervention in order to improve on the following deficits Postural dysfunction;Improper body mechanics;Abnormal gait;Decreased range of motion;Decreased strength;Decreased mobility;Difficulty walking;Decreased activity tolerance;Decreased balance   Rehab Potential Good   PT Frequency 2x / week   PT Duration 6 weeks   PT Treatment/Interventions Patient/family education;ADLs/Self Care Home Management;Neuromuscular re-education;Therapeutic exercise;Therapeutic activities;Gait training;Manual techniques;Dry needling;Moist Heat;Ultrasound;Cryotherapy;Electrical Stimulation   PT Next Visit Plan assess Berg and/or leg strength.  Add UE posture exercises, to HEP if tolerated.    Consulted and Agree with Plan of Care Patient        Problem List Patient Active Problem List   Diagnosis Date Noted  . Gait instability 04/19/2015  . Cardiac pacemaker in situ 03/08/2015  . Cough 03/08/2015  . History of glaucoma 03/08/2015  . Palpitations 03/08/2015  . Cardiac risk counseling 03/08/2015  . History of tachycardia-bradycardia syndrome 03/08/2015  . Postmenopausal 03/08/2015  . Osteoporosis 03/08/2015  . Disorder of bone and cartilage 03/08/2015  . Hyperlipidemia 03/08/2015  . History of skin cancer 03/08/2015    Kerin Perna, PTA 04/24/2015 2:42 PM  [place], [place], [address] Phone: [phone]   Fax:  [phone]  Name: Amanda Travis MRN: [id number] Date of Birth: Feb 03, 1929

## 2015-04-27 ENCOUNTER — Ambulatory Visit (INDEPENDENT_AMBULATORY_CARE_PROVIDER_SITE_OTHER): Payer: Medicare Other | Admitting: Physical Therapy

## 2015-04-27 DIAGNOSIS — R269 Unspecified abnormalities of gait and mobility: Secondary | ICD-10-CM | POA: Diagnosis not present

## 2015-04-27 DIAGNOSIS — R29898 Other symptoms and signs involving the musculoskeletal system: Secondary | ICD-10-CM | POA: Diagnosis not present

## 2015-04-27 DIAGNOSIS — R2681 Unsteadiness on feet: Secondary | ICD-10-CM | POA: Diagnosis not present

## 2015-04-27 DIAGNOSIS — Z7409 Other reduced mobility: Secondary | ICD-10-CM

## 2015-04-27 DIAGNOSIS — R293 Abnormal posture: Secondary | ICD-10-CM

## 2015-04-27 NOTE — Therapy (Signed)
[place], [place], [address] Phone: [phone]   Fax:  [phone]  Physical Therapy Treatment  Patient Details  Name: Amanda Travis MRN: [id number] Date of Birth: 16-Nov-1928 Referring Provider: Dr. Emeterio Reeve   Encounter Date: 04/27/2015      PT End of Session - 04/27/15 1408    Visit Number 7   Number of Visits 12   Date for PT Re-Evaluation 05/19/15   PT Start Time 1402   PT Stop Time 1445   PT Time Calculation (min) 43 min   Activity Tolerance Patient tolerated treatment well  some mild pain in LB with standing activities      Past Medical History  Diagnosis Date  . Disorder of bone and cartilage 03/08/2015  . Osteoporosis 03/08/2015    previously on Fosamax, pt told to stop this due to dental procedure. Will repeat DEXA.    . Cardiac pacemaker in situ 03/08/2015    Placed for Tachy/Brady syndrome   . Cardiac risk counseling 03/08/2015    Await lipids ot calculate ASCVD risk score. No hx known MI/CVA   . Cough 03/08/2015  . History of glaucoma 03/08/2015  . History of skin cancer 03/08/2015  . History of tachycardia-bradycardia syndrome 03/08/2015  . Hyperlipidemia 03/08/2015  . Palpitations 03/08/2015    Pt concerned for Afib.CHA2DDS2-VASc Score 3.2%. Pt reports pacemaker check revealed AFib but I don't see Afib on PM reports, EKG sinus   . Postmenopausal 03/08/2015  . Gait instability 04/19/2015    Past Surgical History  Procedure Laterality Date  . Appendectomy    . Eye surgery      CATARACT  . Ovarian cancer  1997  . Joint replacement Bilateral     KNEE  . Cholecystectomy    . Mohs surgery  2015  . Tooth extraction    . Blood clot  2006  . Colon surgery  1998  . Abdominal hysterectomy  1979    There were no vitals filed for this visit.  Visit Diagnosis:  Abnormal posture  Abnormal gait  Weakness of both lower extremities  Unsteadiness  Impaired functional  mobility, balance, and endurance      Subjective Assessment - 04/27/15 1408    Subjective Pt reports she feels she is getting more steady and upright.     Currently in Pain? No/denies  pain in knees in mornings.             [facility] PT Assessment - 04/27/15 0001    Assessment   Medical Diagnosis abnormal gait pattern decreased safety    Referring Provider Dr. Emeterio Reeve    Onset Date/Surgical Date 09/19/14   Hand Dominance Right   Next MD Visit 6/17   Prior Therapy none for current problems    Standardized Balance Assessment   Standardized Balance Assessment Timed Up and Go Test   Berg Balance Test   Sit to Stand Able to stand without using hands and stabilize independently   Standing Unsupported Able to stand safely 2 minutes   Sitting with Back Unsupported but Feet Supported on Floor or Stool Able to sit safely and securely 2 minutes   Stand to Sit Sits safely with minimal use of hands   Transfers Able to transfer safely, minor use of hands   Standing Unsupported with Eyes Closed Able to stand 10 seconds safely   Standing Ubsupported with Feet Together Able to place feet together independently and stand 1 minute safely  From Standing, Reach Forward with Outstretched Arm Can reach confidently >25 cm (10")   From Standing Position, Pick up Object from [place] to pick up shoe safely and easily   From Standing Position, Turn to Look Behind Over each Shoulder Looks behind one side only/other side shows less weight shift  less rotation to the Rt.    Turn 360 Degrees Able to turn 360 degrees safely but slowly  8 seconds each direction   Standing Unsupported, Alternately Place Feet on Step/Stool Able to stand independently and safely and complete 8 steps in 20 seconds   Standing Unsupported, One Foot in Front Able to take small step independently and hold 30 seconds   Standing on One Leg Tries to lift leg/unable to hold 3 seconds but remains standing independently  3 sec on  Lt, 2 sec on Rt.    Total Score 48   Timed Up and Go Test   Normal TUG (seconds) 27.64  pt used small based quad cane.           [facility] Adult PT Treatment/Exercise - 04/27/15 0001    Knee/Hip Exercises: Stretches   Gastroc Stretch Right;Left;60 seconds;3 reps   Gastroc Stretch Limitations pt reported some calf stretch, mostly hip flexor / quad    Knee/Hip Exercises: Aerobic   Nustep L4: 5 min    Knee/Hip Exercises: Standing   Heel Raises 1 set;20 reps;Both   Other Standing Knee Exercises side stepping 12 ft Rt/Lt with VC for increased step height.    Other Standing Knee Exercises semi-tandem stance Rt/ Lt x 20 sec      Step forward / back ward over 1/2 foam roll x 5 reps each side; repeated to side  With light UE support.  Trunk Extension with hands on railing x 10 sec hold x 10 reps        PT Education - 04/27/15 1720    Education provided Yes   Education Details Pt issued red band for existing HEP   Person(s) Educated Patient   Methods Explanation   Comprehension Verbalized understanding             PT Long Term Goals - 04/27/15 1720    PT LONG TERM GOAL #1   Title Improve strength bilat LE's 1/2 to 1 muscle grade 05/19/15   Time 6   Period Weeks   Status On-going   PT LONG TERM GOAL #2   Title Improve quality and safety of gait 05/19/15   Time 6   Period Weeks   Status On-going   PT LONG TERM GOAL #3   Title Improve Berg balance Scale score by 5-8 points 05/19/15   Time 6   Period Weeks   Status Achieved   PT LONG TERM GOAL #4   Title I in HEP 05/19/15   Time 6   Period Weeks   Status On-going   PT LONG TERM GOAL #5   Title Pt demo ability to step up and down a curb with standby to contact [place] 05/19/15    Time 6   Period Weeks   Status Achieved               Plan - 04/27/15 1717    Clinical Impression Statement Pt scored 48 on Berg today with improved balance; has met LTG #3.  Pt continues to have difficulty with SLS activities and  ambulates at a slow pace (TUG of 27 sec; increased risk for fall in community)   Pt  did have some increased back pain with attempts at SLS; pt declined modalities - will do at home.  Making good progress towards other goals.    Pt will benefit from skilled therapeutic intervention in order to improve on the following deficits Postural dysfunction;Improper body mechanics;Abnormal gait;Decreased range of motion;Decreased strength;Decreased mobility;Difficulty walking;Decreased activity tolerance;Decreased balance   Rehab Potential Good   PT Frequency 2x / week   PT Duration 6 weeks   PT Treatment/Interventions Patient/family education;ADLs/Self Care Home Management;Neuromuscular re-education;Therapeutic exercise;Therapeutic activities;Gait training;Manual techniques;Dry needling;Moist Heat;Ultrasound;Cryotherapy;Electrical Stimulation   PT Next Visit Plan Continue progressive LE and postural training as well as balance activities.  Assess LE strength.    Consulted and Agree with Plan of Care Patient        Problem List Patient Active Problem List   Diagnosis Date Noted  . Gait instability 04/19/2015  . Cardiac pacemaker in situ 03/08/2015  . Cough 03/08/2015  . History of glaucoma 03/08/2015  . Palpitations 03/08/2015  . Cardiac risk counseling 03/08/2015  . History of tachycardia-bradycardia syndrome 03/08/2015  . Postmenopausal 03/08/2015  . Osteoporosis 03/08/2015  . Disorder of bone and cartilage 03/08/2015  . Hyperlipidemia 03/08/2015  . History of skin cancer 03/08/2015   Kerin Perna, PTA 04/27/2015 5:21 PM  [place], [place], [address] Phone: [phone]   Fax:  [phone]  Name: Yessika Otte MRN: [id number] Date of Birth: [birth year]

## 2015-05-01 ENCOUNTER — Ambulatory Visit (INDEPENDENT_AMBULATORY_CARE_PROVIDER_SITE_OTHER): Payer: Medicare Other | Admitting: Rehabilitative and Restorative Service Providers"

## 2015-05-01 ENCOUNTER — Encounter: Payer: Self-pay | Admitting: Rehabilitative and Restorative Service Providers"

## 2015-05-01 DIAGNOSIS — Z7409 Other reduced mobility: Secondary | ICD-10-CM

## 2015-05-01 DIAGNOSIS — R269 Unspecified abnormalities of gait and mobility: Secondary | ICD-10-CM | POA: Diagnosis not present

## 2015-05-01 DIAGNOSIS — R293 Abnormal posture: Secondary | ICD-10-CM | POA: Diagnosis present

## 2015-05-01 DIAGNOSIS — R2681 Unsteadiness on feet: Secondary | ICD-10-CM | POA: Diagnosis not present

## 2015-05-01 DIAGNOSIS — R29898 Other symptoms and signs involving the musculoskeletal system: Secondary | ICD-10-CM | POA: Diagnosis not present

## 2015-05-01 NOTE — Therapy (Signed)
[place], [place], [address] Phone: [phone]   Fax:  [phone]  Physical Therapy Treatment  Patient Details  Name: Amanda Travis MRN: [id number] Date of Birth: Jan 10, 1929 Referring Provider: Dr. Emeterio Reeve  Encounter Date: 05/01/2015      PT End of Session - 05/01/15 0710    Visit Number 8   Number of Visits 12   Date for PT Re-Evaluation 05/19/15   PT Start Time 0707   PT Stop Time 0738   PT Time Calculation (min) 31 min   Activity Tolerance Patient tolerated treatment well      Past Medical History  Diagnosis Date  . Disorder of bone and cartilage 03/08/2015  . Osteoporosis 03/08/2015    previously on Fosamax, pt told to stop this due to dental procedure. Will repeat DEXA.    . Cardiac pacemaker in situ 03/08/2015    Placed for Tachy/Brady syndrome   . Cardiac risk counseling 03/08/2015    Await lipids ot calculate ASCVD risk score. No hx known MI/CVA   . Cough 03/08/2015  . History of glaucoma 03/08/2015  . History of skin cancer 03/08/2015  . History of tachycardia-bradycardia syndrome 03/08/2015  . Hyperlipidemia 03/08/2015  . Palpitations 03/08/2015    Pt concerned for Afib.CHA2DDS2-VASc Score 3.2%. Pt reports pacemaker check revealed AFib but I don't see Afib on PM reports, EKG sinus   . Postmenopausal 03/08/2015  . Gait instability 04/19/2015    Past Surgical History  Procedure Laterality Date  . Appendectomy    . Eye surgery      CATARACT  . Ovarian cancer  1997  . Joint replacement Bilateral     KNEE  . Cholecystectomy    . Mohs surgery  2015  . Tooth extraction    . Blood clot  2006  . Colon surgery  1998  . Abdominal hysterectomy  1979    There were no vitals filed for this visit.  Visit Diagnosis:  Abnormal posture  Abnormal gait  Weakness of both lower extremities  Unsteadiness  Impaired functional mobility, balance, and endurance      Subjective  Assessment - 05/01/15 0709    Subjective Did not feel well yesterday - not sure why. Rested and feels better this morning. Balance is still her biggest problem.    Currently in Pain? No/denies            [facility] PT Assessment - 05/01/15 0001    Assessment   Medical Diagnosis abnormal gait pattern decreased safety    Referring Provider Dr. Emeterio Reeve   Onset Date/Surgical Date 09/19/14   Hand Dominance Right   Next MD Visit 6/17   Prior Therapy none for current problems    Strength   Right Hip Flexion 4+/5   Right Hip Extension 3/5   Right Hip ABduction 4-/5   Left Hip Flexion 4+/5   Left Hip Extension 3-/5   Left Hip ABduction 3+/5   Right Knee Flexion 4+/5   Right Knee Extension 4+/5   Left Knee Flexion 4+/5   Left Knee Extension 4+/5                     [facility] Adult PT Treatment/Exercise - 05/01/15 0001    Ambulation/Gait   Gait Comments working on ambulation with improved posture and alignment; avoiding looking down and working to extend hips and trunk    Knee/Hip Exercises: Stretches   Passive Hamstring Stretch Right;Left;2 reps;30 seconds  Quad Stretch Right;Left;5 reps;20 seconds  prone   Quad Stretch Limitations assisted by PT   Hip Flexor Stretch Right;Left;3 reps;30 seconds   Hip Flexor Stretch Limitations sidelying assisted by PT   Gastroc Stretch Right;Left;30 seconds;2 reps   Knee/Hip Exercises: Standing   Other Standing Knee Exercises standing back at wall pushing hips away from wall 20-30 sec hold x 5    Knee/Hip Exercises: Supine   Hip Adduction Isometric Strengthening;Both;1 set;5 reps   Bridges Limitations bridging x5 with 5-10 sec hold    Knee/Hip Exercises: Sidelying   Hip ABduction Right;Left;1 set;5 reps   Knee/Hip Exercises: Prone   Hip Extension Right;Left;1 set;10 reps                     PT Long Term Goals - 05/01/15 0714    PT LONG TERM GOAL #1   Title Improve strength bilat LE's 1/2 to 1 muscle grade  05/19/15   Time 6   Period Weeks   Status On-going   PT LONG TERM GOAL #2   Title Improve quality and safety of gait 05/19/15   Time 6   Period Weeks   Status On-going   PT LONG TERM GOAL #3   Title Improve Berg balance Scale score by 5-8 points 05/19/15   Time 6   Period Weeks   Status Achieved   PT LONG TERM GOAL #4   Title I in HEP 05/19/15   Time 6   Period Weeks   Status On-going               Plan - 05/01/15 0713    Clinical Impression Statement Amanda Travis reports that she is improving but continues to have the greatest  problems with balance. Patient has myofacial tightness through the hip flexors and quads as well as weaking through hips which limits full trunk and hip entension in standing. Will benefit from stretching to hip and quad musculature assisted by therapist as well as continued strengthening. Progressing toward stated goals of therapy.    Pt will benefit from skilled therapeutic intervention in order to improve on the following deficits Postural dysfunction;Improper body mechanics;Abnormal gait;Decreased range of motion;Decreased strength;Decreased mobility;Difficulty walking;Decreased activity tolerance;Decreased balance   Rehab Potential Good   PT Frequency 2x / week   PT Duration 6 weeks   PT Treatment/Interventions Patient/family education;ADLs/Self Care Home Management;Neuromuscular re-education;Therapeutic exercise;Therapeutic activities;Gait training;Manual techniques;Dry needling;Moist Heat;Ultrasound;Cryotherapy;Electrical Stimulation   PT Next Visit Plan Continue progressive LE and postural training as well as balance activities.      PT Home Exercise Plan HEP   Consulted and Agree with Plan of Care Patient        Problem List Patient Active Problem List   Diagnosis Date Noted  . Gait instability 04/19/2015  . Cardiac pacemaker in situ 03/08/2015  . Cough 03/08/2015  . History of glaucoma 03/08/2015  . Palpitations 03/08/2015  . Cardiac risk  counseling 03/08/2015  . History of tachycardia-bradycardia syndrome 03/08/2015  . Postmenopausal 03/08/2015  . Osteoporosis 03/08/2015  . Disorder of bone and cartilage 03/08/2015  . Hyperlipidemia 03/08/2015  . History of skin cancer 03/08/2015    Torin Whisner Nilda Simmer PT, MPH  05/01/2015, 9:09 AM  [facility] [place], [place], [address] Phone: [phone]   Fax:  [phone]  Name: Amanda Travis MRN: [id number] Date of Birth: 1928/08/22

## 2015-05-03 ENCOUNTER — Encounter: Payer: Medicare Other | Admitting: Rehabilitative and Restorative Service Providers"

## 2015-05-08 ENCOUNTER — Encounter: Payer: Self-pay | Admitting: Rehabilitative and Restorative Service Providers"

## 2015-05-08 ENCOUNTER — Ambulatory Visit (INDEPENDENT_AMBULATORY_CARE_PROVIDER_SITE_OTHER): Payer: Medicare Other | Admitting: Rehabilitative and Restorative Service Providers"

## 2015-05-08 DIAGNOSIS — R29898 Other symptoms and signs involving the musculoskeletal system: Secondary | ICD-10-CM

## 2015-05-08 DIAGNOSIS — Z7409 Other reduced mobility: Secondary | ICD-10-CM

## 2015-05-08 DIAGNOSIS — R269 Unspecified abnormalities of gait and mobility: Secondary | ICD-10-CM

## 2015-05-08 DIAGNOSIS — R2681 Unsteadiness on feet: Secondary | ICD-10-CM

## 2015-05-08 DIAGNOSIS — R293 Abnormal posture: Secondary | ICD-10-CM | POA: Diagnosis present

## 2015-05-08 NOTE — Therapy (Signed)
[place], [place], [address] Phone: [phone]   Fax:  [phone]  Physical Therapy Treatment  Patient Details  Name: Amanda Travis MRN: [id number] Date of Birth: 01-19-1929 Referring Provider: Dr. Emeterio Reeve   Encounter Date: 05/08/2015      PT End of Session - 05/08/15 0705    Visit Number 9   Number of Visits 12   Date for PT Re-Evaluation 05/19/15   PT Start Time 0703   PT Stop Time 0736   PT Time Calculation (min) 33 min   Activity Tolerance Patient tolerated treatment well      Past Medical History  Diagnosis Date  . Disorder of bone and cartilage 03/08/2015  . Osteoporosis 03/08/2015    previously on Fosamax, pt told to stop this due to dental procedure. Will repeat DEXA.    . Cardiac pacemaker in situ 03/08/2015    Placed for Tachy/Brady syndrome   . Cardiac risk counseling 03/08/2015    Await lipids ot calculate ASCVD risk score. No hx known MI/CVA   . Cough 03/08/2015  . History of glaucoma 03/08/2015  . History of skin cancer 03/08/2015  . History of tachycardia-bradycardia syndrome 03/08/2015  . Hyperlipidemia 03/08/2015  . Palpitations 03/08/2015    Pt concerned for Afib.CHA2DDS2-VASc Score 3.2%. Pt reports pacemaker check revealed AFib but I don't see Afib on PM reports, EKG sinus   . Postmenopausal 03/08/2015  . Gait instability 04/19/2015    Past Surgical History  Procedure Laterality Date  . Appendectomy    . Eye surgery      CATARACT  . Ovarian cancer  1997  . Joint replacement Bilateral     KNEE  . Cholecystectomy    . Mohs surgery  2015  . Tooth extraction    . Blood clot  2006  . Colon surgery  1998  . Abdominal hysterectomy  1979    There were no vitals filed for this visit.  Visit Diagnosis:  Abnormal posture  Abnormal gait  Weakness of both lower extremities  Unsteadiness  Impaired functional mobility, balance, and endurance      Subjective  Assessment - 05/08/15 0728    Subjective Doing well and feels she is making some progress with walking. Difficulty walking on grass and carpet.    Currently in Pain? No/denies            [facility] PT Assessment - 05/08/15 0001    Assessment   Medical Diagnosis abnormal gait pattern decreased safety    Referring Provider Dr. Emeterio Reeve    Onset Date/Surgical Date 09/19/14   Hand Dominance Right   Next MD Visit 6/17   Prior Therapy none for current problems    Berg Balance Test   Sit to Stand Able to stand without using hands and stabilize independently   Standing Unsupported Able to stand safely 2 minutes   Sitting with Back Unsupported but Feet Supported on Floor or Stool Able to sit safely and securely 2 minutes   Stand to Sit Sits safely with minimal use of hands   Transfers Able to transfer safely, minor use of hands   Standing Unsupported with Eyes Closed Able to stand 10 seconds safely   Standing Ubsupported with Feet Together Able to place feet together independently and stand 1 minute safely   From Standing, Reach Forward with Outstretched Arm Can reach confidently >25 cm (10")   From Standing Position, Pick up Object from Floor Able to pick  up shoe safely and easily   From Standing Position, Turn to Look Behind Over each Shoulder Looks behind one side only/other side shows less weight shift   Turn 360 Degrees Able to turn 360 degrees safely but slowly  7 sec to Rt 8 sec to L    Standing Unsupported, Alternately Place Feet on Step/Stool Able to stand independently and safely and complete 8 steps in 20 seconds   Standing Unsupported, One Foot in [place] to take small step independently and hold 30 seconds   Standing on One Leg Tries to lift leg/unable to hold 3 seconds but remains standing independently   Total Score 48                     [facility] Adult PT Treatment/Exercise - 05/08/15 0001    Ambulation/Gait   Gait Comments working on ambulation with  improved posture and alignment; avoiding looking down and working to extend hips and trunk; owrking on side steps incooporating arm movements; working on backward and forward walking with shelf arms for support 10-20 feet 4-6 reps    Posture/Postural Control   Posture Comments working on standing posture with focus on bilat hip extension    Knee/Hip Exercises: Aerobic   Nustep L4: 5 min    Knee/Hip Exercises: Standing   Other Standing Knee Exercises standing back at wall pushing hips away from wall 20-30 sec hold x 5    Other Standing Knee Exercises hip extension facing wall hands on wall for support 5 reps each LE x 3 sets                 PT Education - 05/08/15 0736    Education provided Yes   Education Details encouraged work on posture and [employer]) Educated Patient   Methods Explanation   Comprehension Verbalized understanding             PT Long Term Goals - 05/08/15 0708    PT LONG TERM GOAL #1   Title Improve strength bilat LE's 1/2 to 1 muscle grade 05/19/15   Time 6   Period Weeks   Status On-going   PT LONG TERM GOAL #2   Title Improve quality and safety of gait 05/19/15   Time 6   Period Weeks   Status On-going   PT LONG TERM GOAL #3   Title Improve Berg balance Scale score by 5-8 points 05/19/15   Time 6   Period Weeks   Status Achieved   PT LONG TERM GOAL #4   Title I in HEP 05/19/15   Time 6   Period Weeks   Status On-going   PT LONG TERM GOAL #5   Title Pt demo ability to step up and down a curb with standby to contact supervisioin 05/19/15    Time 6   Period Weeks   Status Achieved               Plan - 05/08/15 0705    Clinical Impression Statement Amanda Travis reports that her biggest goal is to improve her balance. She is working on her exercises at home and feels she has made some progress. Amanda Travis is gradually progressing toward stated goals of therapy.    Pt will benefit from skilled therapeutic intervention in order to improve  on the following deficits Postural dysfunction;Improper body mechanics;Abnormal gait;Decreased range of motion;Decreased strength;Decreased mobility;Difficulty walking;Decreased activity tolerance;Decreased balance   Rehab Potential Good   PT Frequency 2x /  week   PT Duration 6 weeks   PT Treatment/Interventions Patient/family education;ADLs/Self Care Home Management;Neuromuscular re-education;Therapeutic exercise;Therapeutic activities;Gait training;Manual techniques;Dry needling;Moist Heat;Ultrasound;Cryotherapy;Electrical Stimulation   PT Next Visit Plan Continue progressive LE and postural training as well as balance activities.      PT Home Exercise Plan HEP        Problem List Patient Active Problem List   Diagnosis Date Noted  . Gait instability 04/19/2015  . Cardiac pacemaker in situ 03/08/2015  . Cough 03/08/2015  . History of glaucoma 03/08/2015  . Palpitations 03/08/2015  . Cardiac risk counseling 03/08/2015  . History of tachycardia-bradycardia syndrome 03/08/2015  . Postmenopausal 03/08/2015  . Osteoporosis 03/08/2015  . Disorder of bone and cartilage 03/08/2015  . Hyperlipidemia 03/08/2015  . History of skin cancer 03/08/2015    Spencer Cardinal Nilda Simmer PT, MPH  05/08/2015, 1:16 PM  [facility] [place], [place], [address] Phone: [phone]   Fax:  [phone]  Name: Amanda Travis MRN: [id number] Date of Birth: May 15, 1928

## 2015-05-10 ENCOUNTER — Ambulatory Visit (INDEPENDENT_AMBULATORY_CARE_PROVIDER_SITE_OTHER): Payer: Medicare Other | Admitting: Rehabilitative and Restorative Service Providers"

## 2015-05-10 ENCOUNTER — Encounter: Payer: Self-pay | Admitting: Rehabilitative and Restorative Service Providers"

## 2015-05-10 DIAGNOSIS — R293 Abnormal posture: Secondary | ICD-10-CM | POA: Diagnosis present

## 2015-05-10 DIAGNOSIS — R2681 Unsteadiness on feet: Secondary | ICD-10-CM

## 2015-05-10 DIAGNOSIS — Z7409 Other reduced mobility: Secondary | ICD-10-CM | POA: Diagnosis not present

## 2015-05-10 DIAGNOSIS — R269 Unspecified abnormalities of gait and mobility: Secondary | ICD-10-CM | POA: Diagnosis not present

## 2015-05-10 DIAGNOSIS — R29898 Other symptoms and signs involving the musculoskeletal system: Secondary | ICD-10-CM

## 2015-05-10 NOTE — Therapy (Signed)
[place], [place], [address] Phone: [phone]   Fax:  [phone]  Physical Therapy Treatment  Patient Details  Name: Amanda Travis MRN: [id number] Date of Birth: 18-Dec-1928 Referring Provider: Dr. Emeterio Reeve  Encounter Date: 05/10/2015      PT End of Session - 05/10/15 0709    Visit Number 10   Number of Visits 12   Date for PT Re-Evaluation 05/19/15   PT Start Time 0703   PT Stop Time 0740   PT Time Calculation (min) 37 min   Activity Tolerance Patient tolerated treatment well      Past Medical History  Diagnosis Date  . Disorder of bone and cartilage 03/08/2015  . Osteoporosis 03/08/2015    previously on Fosamax, pt told to stop this due to dental procedure. Will repeat DEXA.    . Cardiac pacemaker in situ 03/08/2015    Placed for Tachy/Brady syndrome   . Cardiac risk counseling 03/08/2015    Await lipids ot calculate ASCVD risk score. No hx known MI/CVA   . Cough 03/08/2015  . History of glaucoma 03/08/2015  . History of skin cancer 03/08/2015  . History of tachycardia-bradycardia syndrome 03/08/2015  . Hyperlipidemia 03/08/2015  . Palpitations 03/08/2015    Pt concerned for Afib.CHA2DDS2-VASc Score 3.2%. Pt reports pacemaker check revealed AFib but I don't see Afib on PM reports, EKG sinus   . Postmenopausal 03/08/2015  . Gait instability 04/19/2015    Past Surgical History  Procedure Laterality Date  . Appendectomy    . Eye surgery      CATARACT  . Ovarian cancer  1997  . Joint replacement Bilateral     KNEE  . Cholecystectomy    . Mohs surgery  2015  . Tooth extraction    . Blood clot  2006  . Colon surgery  1998  . Abdominal hysterectomy  1979    There were no vitals filed for this visit.  Visit Diagnosis:  Abnormal posture  Abnormal gait  Weakness of both lower extremities  Unsteadiness  Impaired functional mobility, balance, and endurance      Subjective  Assessment - 05/10/15 0709    Subjective Sore and aching in her back - does not move when she sleeps at night and always wakes up sore in her back. Some soreness from the exercises last PT visit. Patient feels she has improved balance since beginning therapy. Would like to continue treatment s long as she can work out the transportatioin with her family.    Currently in Pain? Yes   Pain Score 4    Pain Location Back   Pain Orientation Lower   Pain Descriptors / Indicators Aching;Sore            [facility] PT Assessment - 05/10/15 0001    Assessment   Medical Diagnosis abnormal gait pattern decreased safety    Referring Provider Dr. Emeterio Reeve   Onset Date/Surgical Date 09/19/14   Hand Dominance Right   Next MD Visit 6/17   Prior Therapy none for current problems    Strength   Right Hip Flexion 4+/5   Right Hip Extension 3/5   Right Hip ABduction 4/5   Left Hip Flexion 4+/5   Left Hip Extension 3/5   Left Hip ABduction 3+/5   Right Knee Flexion 4+/5   Right Knee Extension 4+/5   Left Knee Flexion 4+/5   Left Knee Extension 4+/5   Berg Balance Test   Sit  to Stand Able to stand without using hands and stabilize independently   Standing Unsupported Able to stand safely 2 minutes   Sitting with Back Unsupported but Feet Supported on Floor or Stool Able to sit safely and securely 2 minutes   Stand to Sit Sits safely with minimal use of hands   Transfers Able to transfer safely, minor use of hands   Standing Unsupported with Eyes Closed Able to stand 10 seconds safely   Standing Ubsupported with Feet Together Able to place feet together independently and stand 1 minute safely   From Standing, Reach Forward with Outstretched Arm Can reach confidently >25 cm (10")   From Standing Position, Pick up Object from Floor Able to pick up shoe safely and easily   From Standing Position, Turn to Look Behind Over each Shoulder Looks behind one side only/other side shows less weight shift    Turn 360 Degrees Able to turn 360 degrees safely but slowly  7 sec to Rt 8 sec to L    Standing Unsupported, Alternately Place Feet on Step/Stool Able to stand independently and safely and complete 8 steps in 20 seconds   Standing Unsupported, One Foot in Front Able to take small step independently and hold 30 seconds   Standing on One Leg Tries to lift leg/unable to hold 3 seconds but remains standing independently   Total Score 48   Timed Up and Go Test   Normal TUG (seconds) 14.2  Normal continued high risk but improved time                      [facility] Adult PT Treatment/Exercise - 05/10/15 0001    Ambulation/Gait   Gait Comments working on ambulation with improved posture and alignment; avoiding looking down and working to extend hips and trunk; working on side steps incooporating arm movements; working on backward and forward walking with shelf arms for support 20-40 feet 6-8 reps    Posture/Postural Control   Posture Comments working on standing posture with focus on bilat hip extension    Knee/Hip Exercises: Stretches   Hip Flexor Stretch Right;Left;3 reps;30 seconds   Hip Flexor Stretch Limitations sidelying assisted by PT   Knee/Hip Exercises: Standing   Other Standing Knee Exercises standing back at wall pushing hips away from wall 20-30 sec hold x 5    Other Standing Knee Exercises hip extension facing wall hands on wall for support 5 reps each LE x 3 sets                      PT Long Term Goals - 05/08/15 0708    PT LONG TERM GOAL #1   Title Improve strength bilat LE's 1/2 to 1 muscle grade 05/19/15   Time 6   Period Weeks   Status On-going   PT LONG TERM GOAL #2   Title Improve quality and safety of gait 05/19/15   Time 6   Period Weeks   Status On-going   PT LONG TERM GOAL #3   Title Improve Berg balance Scale score by 5-8 points 05/19/15   Time 6   Period Weeks   Status Achieved   PT LONG TERM GOAL #4   Title I in HEP 05/19/15   Time 6    Period Weeks   Status On-going   PT LONG TERM GOAL #5   Title Pt demo ability to step up and down a curb with standby to contact supervisioin 05/19/15  Time 6   Period Weeks   Status Achieved               Plan - 05/10/15 0755    Clinical Impression Statement Amanda Travis reports improving balance and that she feels more secure with her walking at home. She is working on her exercises and activities at home around the kitchen counter so she is safe. Patient demonstrates good improvement with BERG and TUG but remains at risk for falls. Amanda Travis will benefit from continued PT to progress with balance and imporve functional gait safety.    Pt will benefit from skilled therapeutic intervention in order to improve on the following deficits Postural dysfunction;Improper body mechanics;Abnormal gait;Decreased range of motion;Decreased strength;Decreased mobility;Difficulty walking;Decreased activity tolerance;Decreased balance   Rehab Potential Good   PT Frequency 2x / week   PT Duration 6 weeks   PT Treatment/Interventions Patient/family education;ADLs/Self Care Home Management;Neuromuscular re-education;Therapeutic exercise;Therapeutic activities;Gait training;Manual techniques;Dry needling;Moist Heat;Ultrasound;Cryotherapy;Electrical Stimulation   PT Next Visit Plan LE and postural training as well as balance activities.      PT Home Exercise Plan HEP   Consulted and Agree with Plan of Care Patient        Problem List Patient Active Problem List   Diagnosis Date Noted  . Gait instability 04/19/2015  . Cardiac pacemaker in situ 03/08/2015  . Cough 03/08/2015  . History of glaucoma 03/08/2015  . Palpitations 03/08/2015  . Cardiac risk counseling 03/08/2015  . History of tachycardia-bradycardia syndrome 03/08/2015  . Postmenopausal 03/08/2015  . Osteoporosis 03/08/2015  . Disorder of bone and cartilage 03/08/2015  . Hyperlipidemia 03/08/2015  . History of skin cancer 03/08/2015     Avriana Joo Nilda Simmer PT, MPH  05/10/2015, 7:58 AM  [facility] [place], [place], [address] Phone: [phone]   Fax:  [phone]  Name: Amanda Travis MRN: [id number] Date of Birth: August 18, 1928

## 2015-05-11 DIAGNOSIS — Z85828 Personal history of other malignant neoplasm of skin: Secondary | ICD-10-CM | POA: Diagnosis not present

## 2015-05-11 DIAGNOSIS — L738 Other specified follicular disorders: Secondary | ICD-10-CM | POA: Diagnosis not present

## 2015-05-11 DIAGNOSIS — Z08 Encounter for follow-up examination after completed treatment for malignant neoplasm: Secondary | ICD-10-CM | POA: Diagnosis not present

## 2015-05-11 DIAGNOSIS — L570 Actinic keratosis: Secondary | ICD-10-CM | POA: Diagnosis not present

## 2015-05-11 DIAGNOSIS — D485 Neoplasm of uncertain behavior of skin: Secondary | ICD-10-CM | POA: Diagnosis not present

## 2015-05-17 ENCOUNTER — Encounter: Payer: Medicare Other | Admitting: Physical Therapy

## 2015-05-18 ENCOUNTER — Emergency Department (INDEPENDENT_AMBULATORY_CARE_PROVIDER_SITE_OTHER)
Admission: EM | Admit: 2015-05-18 | Discharge: 2015-05-18 | Disposition: A | Discharge status: Home / Self Care | Payer: Medicare Other | Source: Home / Self Care | Attending: Family Medicine | Admitting: Family Medicine

## 2015-05-18 DIAGNOSIS — J209 Acute bronchitis, unspecified: Secondary | ICD-10-CM

## 2015-05-18 MED ORDER — AZITHROMYCIN 250 MG PO TABS: 250.0000 mg | ORAL_TABLET | Freq: Every day | ORAL | Status: DC

## 2015-05-18 NOTE — Discharge Instructions (Signed)
You may continue to take Dayquil and Nyquil for fever and cough.    Follow-up with your primary care provider next week for recheck of symptoms if not improving.  Be sure to drink plenty of fluids and rest, at least 8hrs of sleep a night, preferably more while you are sick. Return urgent care or go to closest ER if you cannot keep down fluids/signs of dehydration, fever not reducing with Tylenol, difficulty breathing/wheezing, stiff neck, worsening condition, or other concerns (see below)  Please take antibiotics as prescribed and be sure to complete entire course even if you start to feel better to ensure infection does not come back.  Be sure to eat a meal while taking the antibiotics.  You may also try over the counter probiotics or eat some yogurt with probiotics such as Activa, to help reduce the risk of diarrhea as all antibiotics do have a risk of causing diarrhea.  Azithromycin is a good medication for pneumonia but it is not as likely to cause diarrhea as other stronger antibiotics like Levaquin or Augmentin.    Acute Bronchitis Bronchitis is when the airways that extend from the windpipe into the lungs get red, puffy, and painful (inflamed). Bronchitis often causes thick spit (mucus) to develop. This leads to a cough. A cough is the most common symptom of bronchitis. In acute bronchitis, the condition usually begins suddenly and goes away over time (usually in 2 weeks). Smoking, allergies, and asthma can make bronchitis worse. Repeated episodes of bronchitis may cause more lung problems. HOME CARE  Rest.  Drink enough fluids to keep your pee (urine) clear or pale yellow (unless you need to limit fluids as told by your doctor).  Only take over-the-counter or prescription medicines as told by your doctor.  Avoid smoking and secondhand smoke. These can make bronchitis worse. If you are a smoker, think about using nicotine gum or skin patches. Quitting smoking will help your lungs heal  faster.  Reduce the chance of getting bronchitis again by:  Washing your hands often.  Avoiding people with cold symptoms.  Trying not to touch your hands to your mouth, nose, or eyes.  Follow up with your doctor as told. GET HELP IF: Your symptoms do not improve after 1 week of treatment. Symptoms include:  Cough.  Fever.  Coughing up thick spit.  Body aches.  Chest congestion.  Chills.  Shortness of breath.  Sore throat. GET HELP RIGHT AWAY IF:   You have an increased fever.  You have chills.  You have severe shortness of breath.  You have bloody thick spit (sputum).  You throw up (vomit) often.  You lose too much body fluid (dehydration).  You have a severe headache.  You faint. MAKE SURE YOU:   Understand these instructions.  Will watch your condition.  Will get help right away if you are not doing well or get worse.   This information is not intended to replace advice given to you by your health care provider. Make sure you discuss any questions you have with your health care provider.   Document Released: 07/24/2007 Document Revised: 10/07/2012 Document Reviewed: 07/28/2012 Elsevier Interactive Patient Education [employer].

## 2015-05-18 NOTE — ED Notes (Signed)
Has had a runny nose for a week, started with a cough 3-4 days ago.  Coughing up yellowish mucous.  Has been taking Nyquil and Dayquil for cold symptoms.

## 2015-05-18 NOTE — ED Provider Notes (Signed)
CSN: [id number]     Arrival date & time 05/18/15  0821 History   First MD Initiated Contact with Patient 05/18/15 0840     Chief Complaint  Patient presents with  . Cough   (Consider location/radiation/quality/duration/timing/severity/associated sxs/prior Treatment) HPI The pt is an 82yo female presenting to [facility] with c/o 1 week of nasal congestion that has developed into a moderately productive cough over the last 3-4 days.  Pt reports yellowed colored sputum.  She has been taking Dayquil and Nyquil with minimal relief.  Pt notes she had some family members come a few weeks ago and some had a cough, no other sick contacts. Denies fever, chills, n/v/d.   Past Medical History  Diagnosis Date  . Disorder of bone and cartilage 03/08/2015  . Osteoporosis 03/08/2015    previously on Fosamax, pt told to stop this due to dental procedure. Will repeat DEXA.    . Cardiac pacemaker in situ 03/08/2015    Placed for Tachy/Brady syndrome   . Cardiac risk counseling 03/08/2015    Await lipids ot calculate ASCVD risk score. No hx known MI/CVA   . Cough 03/08/2015  . History of glaucoma 03/08/2015  . History of skin cancer 03/08/2015  . History of tachycardia-bradycardia syndrome 03/08/2015  . Hyperlipidemia 03/08/2015  . Palpitations 03/08/2015    Pt concerned for Afib.CHA2DDS2-VASc Score 3.2%. Pt reports pacemaker check revealed AFib but I don't see Afib on PM reports, EKG sinus   . Postmenopausal 03/08/2015  . Gait instability 04/19/2015   Past Surgical History  Procedure Laterality Date  . Appendectomy    . Eye surgery      CATARACT  . Ovarian cancer  1997  . Joint replacement Bilateral     KNEE  . Cholecystectomy    . Mohs surgery  2015  . Tooth extraction    . Blood clot  2006  . Colon surgery  1998  . Abdominal hysterectomy  1979   Family History  Problem Relation Age of Onset  . Stroke Mother   . Heart disease Sister   . Dementia Brother   . Heart disease Brother    Social History   Substance Use Topics  . Smoking status: Never Smoker   . Smokeless tobacco: None  . Alcohol Use: No   OB History    No data available     Review of Systems  Constitutional: Negative for fever and chills.  HENT: Positive for congestion and rhinorrhea. Negative for ear pain, sore throat, trouble swallowing and voice change.   Respiratory: Positive for cough and wheezing. Negative for shortness of breath.   Cardiovascular: Negative for chest pain and palpitations.  Gastrointestinal: Negative for nausea, vomiting, abdominal pain and diarrhea.  Musculoskeletal: Negative for myalgias, back pain and arthralgias.  Skin: Negative for rash.    Allergies  Keflex; Tetracyclines & related; and Valtrex  Home Medications   Prior to Admission medications   Medication Sig Start Date End Date Taking? Authorizing Provider  ARTIFICIAL TEARS 0.1-0.3 % SOLN Apply to eye as needed.     Historical Provider, MD  azithromycin (ZITHROMAX) 250 MG tablet Take 1 tablet (250 mg total) by mouth daily. Take first 2 tablets together, then 1 every day until finished. 05/18/15   Noland Fordyce, PA-C  Calcium Carbonate-Vitamin D 600-400 MG-UNIT chew tablet Chew 2 tablets by mouth daily. 04/05/15   Emeterio Reeve, DO  Cholecalciferol (VITAMIN D3) 2000 units capsule Take 2,000 Units by mouth daily.    Historical Provider, MD  diclofenac (VOLTAREN) 75 MG EC tablet Take 1 tablet (75 mg total) by mouth 2 (two) times daily. 04/05/15   Emeterio Reeve, DO  Echinacea 125 MG CAPS Take by mouth. Take 200 mg by mouth daily    Historical Provider, MD  ferrous sulfate 325 (65 FE) MG tablet Take 325 mg by mouth daily with breakfast.    Historical Provider, MD  Flaxseed, Linseed, (FLAXSEED OIL PO) Take by mouth.    Historical Provider, MD  FLUZONE HIGH-DOSE 0.5 ML SUSY ADM 0.5ML IM UTD 12/08/14   Historical Provider, MD  Garlic [id number] MG TABS Take by mouth. Take 1000 mg by mouth daily    Historical Provider, MD  Glucosamine  Sulfate 500 MG TABS Take by mouth. Take 200 mg by mouth daily    Historical Provider, MD  imiquimod (ALDARA) 5 % cream Apply topically at bedtime. Apply every other day for 2 months    Historical Provider, MD  latanoprost (XALATAN) 0.005 % ophthalmic solution 1 drop at bedtime.    Historical Provider, MD  loratadine (CLARITIN) 10 MG tablet Take 10 mg by mouth daily.    Historical Provider, MD  Lysine 500 MG CAPS Take by mouth. Take 1000 mg by mouth daily    Historical Provider, MD  Melatonin 5 MG TABS Take by mouth. Take 5 mg by mouth daily    Historical Provider, MD  Multiple Vitamin (MULTIVITAMIN) tablet Take 1 tablet by mouth daily.    Historical Provider, MD  OMEGA-3 FATTY ACIDS PO Take by mouth. Take 1 tablet by mouth daily    Historical Provider, MD  omeprazole (PRILOSEC) 20 MG capsule Take 20 mg by mouth daily.    Historical Provider, MD  oxybutynin (DITROPAN) 5 MG tablet Take 1 tablet (5 mg total) by mouth 2 (two) times daily. 04/05/15   Emeterio Reeve, DO  PNEUMOVAX 23 25 MCG/0.5ML injection ADM 0.5ML IM UTD 02/07/15   Historical Provider, MD  Policosanol 10 MG CAPS Take by mouth. Take by mouth 2 times daily    Historical Provider, MD  Potassium 99 MG TABS Take by mouth.    Historical Provider, MD  timolol (TIMOPTIC) 0.25 % ophthalmic solution 1 drop 2 (two) times daily.    Historical Provider, MD  vitamin C (ASCORBIC ACID) 500 MG tablet Take 500 mg by mouth daily.    Historical Provider, MD   Meds Ordered and Administered this Visit  Medications - No data to display  BP 179/97 mmHg  Pulse 79  Temp(Src) 98.2 F (36.8 C) (Oral)  Ht 5\' 1"  (1.549 m)  Wt 145 lb 4 oz (65.885 kg)  BMI 27.46 kg/m2  SpO2 96%  LMP  No data found.   Physical Exam  Constitutional: She appears well-developed and well-nourished. No distress.  HENT:  Head: Normocephalic and atraumatic.  Right Ear: Tympanic membrane normal.  Left Ear: Tympanic membrane normal.  Nose: Mucosal edema and rhinorrhea  present.  Mouth/Throat: Uvula is midline, oropharynx is clear and moist and mucous membranes are normal.  Eyes: Conjunctivae are normal. No scleral icterus.  Neck: Normal range of motion.  Cardiovascular: Normal rate, regular rhythm and normal heart sounds.   Pulmonary/Chest: Effort normal. No respiratory distress. She has no wheezes. She has rhonchi in the right lower field. She has no rales. She exhibits no tenderness.  Faint rhonchi in Right lower lung field.  No respiratory distress.   Abdominal: Soft. Bowel sounds are normal. She exhibits no distension and no mass. There is no tenderness. There  is no rebound and no guarding.  Musculoskeletal: Normal range of motion.  Neurological: She is alert.  Skin: Skin is warm and dry. She is not diaphoretic.  Nursing note and vitals reviewed.   ED Course  Procedures (including critical care time)  Labs Review Labs Reviewed - No data to display  Imaging Review No results found.     MDM   1. Acute bronchitis, unspecified organism    Pt c/o URI symptoms for about 1 week, progressively worsening.  She does have a pacemaker. Denies chest pain.  Will treat for possible pneumonia given lung sounds.  Rx: azithromycin  Encouraged to continue taking Dayquil and Nyquil, stay well hydrated and get plenty of rest. F/u with PCP early next week if not improving, or return this weekend if needed. Discussed symptoms that warrant emergent care in the ED. Patient verbalized understanding and agreement with treatment plan.     Noland Fordyce, PA-C 05/18/15 1049

## 2015-05-22 ENCOUNTER — Ambulatory Visit (INDEPENDENT_AMBULATORY_CARE_PROVIDER_SITE_OTHER): Payer: Medicare Other | Admitting: Osteopathic Medicine

## 2015-05-22 ENCOUNTER — Encounter: Payer: Self-pay | Admitting: Osteopathic Medicine

## 2015-05-22 VITALS — BP 139/66 | HR 59 | Temp 98.4°F | Ht 61.0 in | Wt 145.0 lb

## 2015-05-22 DIAGNOSIS — R05 Cough: Secondary | ICD-10-CM | POA: Diagnosis not present

## 2015-05-22 DIAGNOSIS — J0110 Acute frontal sinusitis, unspecified: Secondary | ICD-10-CM

## 2015-05-22 DIAGNOSIS — R030 Elevated blood-pressure reading, without diagnosis of hypertension: Secondary | ICD-10-CM

## 2015-05-22 DIAGNOSIS — R058 Other specified cough: Secondary | ICD-10-CM

## 2015-05-22 DIAGNOSIS — R059 Cough, unspecified: Secondary | ICD-10-CM

## 2015-05-22 DIAGNOSIS — IMO0001 Reserved for inherently not codable concepts without codable children: Secondary | ICD-10-CM

## 2015-05-22 MED ORDER — BENZONATATE 200 MG PO CAPS: 200.0000 mg | ORAL_CAPSULE | Freq: Three times a day (TID) | ORAL | Status: DC | PRN

## 2015-05-22 MED ORDER — IPRATROPIUM BROMIDE 0.03 % NA SOLN: 2.0000 | Freq: Three times a day (TID) | NASAL | Status: DC

## 2015-05-22 MED ORDER — METHYLPREDNISOLONE 4 MG PO TBPK: ORAL_TABLET | ORAL | Status: DC

## 2015-05-22 NOTE — Progress Notes (Signed)
HPI: Amanda Travis is a 82 y.o. female who presents to [place]  today for chief complaint of:  Chief Complaint  Patient presents with  . Cough    . Location: sinuses and chest . Quality: congestion and coughing . Assoc signs/symptoms: see ROS . Duration: 4 days ago urgent care, maybe 7 days altogether  . Modifying factors: has tried the following OTC/Rx medications: Urgent care treated with Z-pack, also taking OTC DayQuil, NyQuil and OTC cough medicine  . Context:      Past medical, social and family history reviewed. Current medications and allergies reviewed.     Review of Systems: CONSTITUTIONAL: no fever/chills HEAD/EYES/EARS/NOSE/THROAT: yes headache, no vision change or hearing change, yes sore throat CARDIAC: No chest pain/pressure/palpitations RESPIRATORY: yes productive cough, no shortness of breath GASTROINTESTINAL: no nausea, no vomiting, no abdominal pain, no diarrhea MUSCULOSKELETAL: chronic myalgia/arthralgia   Exam:  BP 139/66 mmHg  Pulse 59  Temp(Src) 98.4 F (36.9 C) (Oral)  Ht 5\' 1"  (1.549 m)  Wt 145 lb (65.772 kg)  BMI 27.41 kg/m2  SpO2 94% Constitutional: VSS, see above. General Appearance: alert, well-developed, well-nourished, NAD Eyes: Normal lids and conjunctive, non-icteric sclera,  Ears, Nose, Mouth, Throat: Normal external inspection ears/nares/mouth/lips/gums, MMM;       posterior pharynx without erythema, without exudate Neck: No masses, trachea midline. normal lymph nodes Respiratory: Normal respiratory effort. No  wheeze/rhonchi/rales Cardiovascular: S1/S2 normal, no murmur/rub/gallop auscultated. RRR.   No results found for this or any previous visit (from the past 72 hour(s)). No results found.   ASSESSMENT/PLAN: Patient reports she is doing better overall, coughing a bit less, advised that persistent cough can result from acute illness, is getting worse would consider chest x-ray. Likely viral  sinusitis, if still not better after a few days would probably try Augmentin, patient was just treated with Z-Pak and finished yesterday. Try Atrovent as noted below, steroid taper, Tessalon. Would consider nebulizer/inhaler if no improvement in cough. ER/RTC precautions reviewed  Post-viral cough syndrome - Plan: methylPREDNISolone (MEDROL DOSEPAK) 4 MG TBPK tablet  Acute frontal sinusitis, recurrence not specified - Plan: ipratropium (ATROVENT) 0.03 % nasal spray  Cough - Plan: benzonatate (TESSALON) 200 MG capsule  Elevated BP    No Follow-up on file.

## 2015-05-22 NOTE — Patient Instructions (Signed)
DR. Redgie Grayer HOME CARE INSTRUCTIONS: VIRAL ILLNESSES - ACHES/PAINS, SORE THROAT, SINUSITIS, COUGH, GASTRITIS  See below for highlighted portions: these medications are safe for you to use.  FRIST, A FEW NOTES ON OVER-THE-COUNTER MEDICATIONS!   USE CAUTION - MANY OVER-THE-COUNTER MEDICATIONS COME IN COMBINATIONS OF MULTIPLE GENERIC MEDICINES. FOR INSTANCE, NYQUIL HAS TYLENOL + COUGH MEDICINE + AN ANTIHISTAMINE, SO BE CAREFUL YOU'RE NOT TAKING A COMBINATION MEDICINE WHICH CONTAINS MEDICATIONS YOU'RE ALSO TAKING SEPARATELY (LIKE NYQUIL SYRUP PLUS TYLENOL PILLS).   YOUR PHARMACIST CAN HELP YOU AVOID MEDICATION INTERACTIONS AND DUPLICATIONS - ASK FOR THEIR HELP IF YOU ARE CONFUSED OR UNSURE ABOUT WHAT TO PURCHASE OVER-THE-COUNTER!   REMEMBER - IF YOU'RE EVER CONCERNED ABOUT MEDICATION SIDE EFFECTS, OR IF YOU'RE EVER CONCERNED YOUR SYMPTOMS ARE GETTING WORSE DESPITE TREATMENT, PLEASE CALL THE DOCTOR'S OFFICE! IF AFTER-HOURS, YOU CAN BE SEEN IN URGENT CARE OR, FOR SEVERE ILLNESS, PLEASE GO TO THE EMERGENCY ROOM.   TREAT SINUS CONGESTION, RUNNY NOSE & POSTNASAL DRIP:  Treat to increase airflow through sinuses, decrease congestion pain and prevent bacterial growth!   Remember, only 0.5-2% of sinus infections are due to a bacteria, the rest are due to a virus (usually the common cold) which will not get better with antibiotics!  NASAL SPRAYS: generally safe and should not interact with other medicines. Can take any of these medications, either alone or together... FLONASE (FLUTICASONE) - 2 sprays in each nostril twice per day (also a great allergy medicine to use long-term!) AFRIN (OXYMETOLAZONE) - use sparingly because it will cause rebound congestion, NEVER USE IN KIDS  SALINE NASAL SPRAY- no limit, but avoid use after other nasal sprays or it can wash the medicine away PRESCRTIPTION ATROVENT - as directed on prescription bottle  ANTIHISTAMINES: Helps dry out runny nose and decreases  postnasal drip. Benadryl may cause drowsiness but other preparations should not be as sedating. Certain kinds are not as safe in elderly individuals. OK to use unless Dr A says otherwise.  Only use one of the following... BENADRYL (DIPHENHYDRAMINE) - 25-50 mg every 6 hours ZYRTEC (CETIRIZINE) - 5-10 mg daily CLARITIN (LORATIDINE) - less potent. 10 mg daily ALLEGRA (FEXOFENADINE) - least likely to cause drowsiness! 180 mg daily or 60 mg twice per day  DECONGESTANTS: Helps dry out runny nose and helps with sinus pain. May cause insomnia, or sometimes elevated heart rate. Can cause problems if used often in people with high blood pressure. NEVER USE IN KIDS UNDER 40 YEARS OLD. Only use one of the following... SUDAFED (PSEUDOEPHEDINE) - 60 mg every 4 - 6 hours, also comes in 120 mg extended release every 12 hours, maximum 240 mg in 24 hours.  SUDAED PE (PHENYLEPHRINE) - 10 mg every 4 - 6 hours, maximum 60 mg per day  COMBINATIONS OF ABOVE ANTIHISTAMINES + DECONGESTANTS: these usually require you to show your ID at the pharmacy counter. You can also purchase these medicines separately as noted above.  Only use one of the following... ZYRTEC-D (CETIRIZINE + PSEUDOEPHEDRINE) - 12 hour formulation as directed CLARITIN-D (LORATIDINE + PSEUDOEPHEDRINE) - 12 and 24 hour formulations as directed ALLEGRA-D (FEXOFENADINE + PSEUDOEPHEDRINE) - 12 and 24 hour formulations as directed  PRESCRIPTION TREATMENT FOR SINUS PROBLEMS: Can include nasal sprays, pills, or antibiotics in the case of true bacterial infection. Not everyone needs an antibiotic but there are other medicines which will help you feel better while your body fights the infection!    TREAT COUGH & SORE THROAT:  Remember, cough is the  body's way of protecting your airways and lungs, it's a hard-wired reflex that is tough for medicines to treat!   Irritation to the airways will cause cough. This irritation is usually caused by upper airway  problems like postnasal drip (treat as above) and viral sore throat, but in severe cases can be due to lower airway problems like bronchitis or pneumonia, which a doctor can usually hear on exam of your lungs or see on an X-ray.  Sore throat is almost always due to a virus, but occasionally caused by certain strains of Strep, which requires antibiotics.   Exercise and smoking may make cough worse - take it easy while you're sick, and QUIT SMOKING!   Cough due to viral infection can linger for 2-3 weeks or so. If you're coughing longer than you think you should, or if the cough is severe, please make an appointment in the office - you may need a chest X-ray.  EXPECTORANT: Used to help clear airways, take these with PLENTY of water to help thin mucus secretions and make the mucus easier to cough up  Only use one of the following... ROBITUSSIN (DEXTROMETHORPHAN OR GUAIFENISEN depending on formulation)  MUCINEX (GUAIFENICEN) - usually longer acting  COUGH DROPS/LOZENGES: Whichever over-the-counter agent you prefer!  Here are some suggestions for ingredients to look for (can take both)... BENZOCAINE - numbing effect, also in [place] - cooling effect  HONEY: has gone head-to-head in several clinical trials with prescription cough medicines and found to be equally effective! Try 1 Teaspoon Honey + 2 Drops Lemon Juice, as much as you want to use. NONE FOR KIDS UNDER AGE 80  HERBAL TEA: There are certain ingredients which help "coat the throat" to relieve pain  such as ELM BARK, LICORICE ROOT, MARSHMALLOW ROOT  PRESCRIPTION TREATMENT FOR COUGH: Reserved for severe cases. Can include pills, syrups or inhalers.    TREAT ACHES & PAINS, FEVER:  Illness causes aches, pains and fever as your body increases its natural inflammation response to help fight the infection.   Rest, good hydration and nutrition, and taking anti-inflammatory medications will help.    Remember: a true fever is a temperature 100.4 or higher. If you have a fever that is 105.0 or higher, that is a dangerous level and needs medical attention in the office or in the ER!  Can take both of these together if it's ok with your doctor... IBUPROFEN - 400-800 mg every 6 - 8 hours. Ibuprofen and similar medications can cause problems for people with heart disease or history of stomach ulcers, check with Dr A first if you're concerned. Lower doses are usually safe and effective.  TYLENOL (ACETAMINOPHEN) - [phone] mg every 6 hours. It won't cause problems with heart or stomach.     TREAT GASTROINTESTINAL SYMPTOMS:  Hydrate, hydrate, hydrate! Try drinking Gatorade/Powerade, broth/soup. Avoid milk and juice, these can make diarrhea worse. You can drink water, of course, but if you are also having vomiting and loose stool you are losing electrolytes which water alone can't replace.  IMMODIUM (LOPERAMIDE) - as directed on the bottle to help with loose stool PRESCRIPTION ZOFRAN (ONDANSETRON) OR PHENERGAN (PROMETHAZINE) - as directed to help nausea and vomiting. Try taking these before you eat if you are having trouble keeping food down.     REMEMBER - THE MOST IMPORTANT THINGS YOU CAN DO TO AVOID CATCHING OR SPREADING ILLNESS INCLUDE:   COVER YOUR COUGH WITH YOUR ARM, NOT WITH YOUR HANDS!   DISINFECT  COMMONLY USED SURFACES (SUCH AS TELEPHONES & DOORKNOBS) WHEN YOU OR SOMEONE CLOSE TO YOU IS FEELING SICK!   BE SURE VACCINES ARE UP TO DATE - GET A FLU SHOT EVERY YEAR! THE FLU CAN BE DEADLY FOR BABIES, ELDERLY FOLKS, AND PEOPLE WITH WEAK IMMUNE SYSTEMS - YOU SHOULD BE VACCINATED TO HELP PREVENT YOURSELF FROM GETTING SICK, BUT ALSO TO PREVENT SOMEONE ELSE FROM GETTING AN INFECTION WHICH MAY HOSPITALIZE OR KILL THEM.  GOOD NUTRITION AND HEALTHY LIFESTYLE WILL HELP YOUR IMMUNE SYSTEM YEAR-ROUND! THERE IS NO MAGIC SUPPLEMENT!  AND ABOVE ALL - [place] YOUR HANDS OFTEN!

## 2015-06-23 ENCOUNTER — Encounter: Payer: Self-pay | Admitting: Physician Assistant

## 2015-06-23 ENCOUNTER — Ambulatory Visit (INDEPENDENT_AMBULATORY_CARE_PROVIDER_SITE_OTHER): Payer: Medicare Other | Admitting: Physician Assistant

## 2015-06-23 VITALS — BP 133/78 | HR 87 | Temp 98.5°F | Ht 61.0 in | Wt 141.0 lb

## 2015-06-23 DIAGNOSIS — G43A0 Cyclical vomiting, not intractable: Secondary | ICD-10-CM | POA: Diagnosis not present

## 2015-06-23 DIAGNOSIS — G43A1 Cyclical vomiting, intractable: Secondary | ICD-10-CM | POA: Diagnosis not present

## 2015-06-23 DIAGNOSIS — K2100 Gastro-esophageal reflux disease with esophagitis, without bleeding: Secondary | ICD-10-CM | POA: Insufficient documentation

## 2015-06-23 DIAGNOSIS — R1115 Cyclical vomiting syndrome unrelated to migraine: Secondary | ICD-10-CM

## 2015-06-23 MED ORDER — SUCRALFATE 1 G PO TABS: 1.0000 g | ORAL_TABLET | Freq: Four times a day (QID) | ORAL | Status: DC

## 2015-06-23 NOTE — Patient Instructions (Signed)
I think your symptoms could be coming from worsening ACID reflux since stopping medication and worsening with laying down. Go back on omeprazole daily and I will add carafate before each meal and bedtime for 6 weeks. You may stop carafate at that time and continue prilosec.

## 2015-06-23 NOTE — Progress Notes (Addendum)
   Subjective:    Patient ID: Amanda Travis, female    DOB: 02/17/1929, 82 y.o.   MRN: [id number]  HPI Patient is here today complaining of nausea and one episode of vomiting at night around 1 AM for the past 2 weeks. She denies blood in vomit, diarrhea, and fever. She states that during the day, she has no nausea, vomiting, or stomach aches. She does report a "tightness" in her esophagus throughout the day but denies difficulty with eating or swallowing. She denies hematochezia or melena.   She had been taking Prilosec daily, but came off of the medication on 05/18/15 while she has receiving treatment for Acute Bronchitis. She started using an OTC antacid 1 week ago PRN.     Review of Systems  All other systems reviewed and are negative.      Objective:   Physical Exam  Constitutional: She is oriented to person, place, and time. She appears well-developed and well-nourished.  HENT:  Head: Normocephalic and atraumatic.  Eyes: Conjunctivae are normal. Right eye exhibits no discharge. Left eye exhibits no discharge.  Cardiovascular: Normal rate, regular rhythm and normal heart sounds.   Abdominal: Soft. Bowel sounds are normal. She exhibits no distension and no mass. There is tenderness (2/10 tenderness to palpation of RLQ and epigastric area. ). There is no rebound and no guarding.  Mild epigastric tenderness. No guarding or rebound.   Neurological: She is alert and oriented to person, place, and time.  Skin: Skin is warm and dry.  Psychiatric: She has a normal mood and affect. Her behavior is normal. Thought content normal.          Assessment & Plan:  1. Cyclical vomiting/GERD- Patient has a history of acid reflux and had been taking daily Prilosec up until the last month. Given that vomiting is cyclic at night when patient is lying down, GERD is likely. I am going to restart daily Prilosec and am sending Carafate for patient to use before meals and bedtime for the next 6 weeks. She  can stop Carafate at that point but should continue daily Prilosec. If not improving or worsening in the next 10 days please call office.

## 2015-07-18 DIAGNOSIS — Z950 Presence of cardiac pacemaker: Secondary | ICD-10-CM | POA: Diagnosis not present

## 2015-07-18 DIAGNOSIS — I495 Sick sinus syndrome: Secondary | ICD-10-CM | POA: Diagnosis not present

## 2015-07-28 ENCOUNTER — Ambulatory Visit (INDEPENDENT_AMBULATORY_CARE_PROVIDER_SITE_OTHER): Payer: Medicare Other | Admitting: Osteopathic Medicine

## 2015-07-28 ENCOUNTER — Encounter: Payer: Self-pay | Admitting: Osteopathic Medicine

## 2015-07-28 VITALS — BP 123/61 | HR 83 | Wt 141.0 lb

## 2015-07-28 DIAGNOSIS — K2100 Gastro-esophageal reflux disease with esophagitis, without bleeding: Secondary | ICD-10-CM

## 2015-07-28 NOTE — Progress Notes (Signed)
HPI: Amanda Travis is a 82 y.o. female who presents to [facility] today for chief complaint of:  Chief Complaint  Patient presents with  . Gastroesophageal Reflux    question about carafate     . Context: seen by Luvenia Starch few weeks ago for GI issues/ cyclic nausea w/ vomiting x 1. Put on Carafate and was taking this qid, waking up in the night vomiting. Quit taking the Carafate and this problem went away. Then started taking it again last week, one day this week wasn't feeling well, lied down and then vomited after about an hour.  . Timing: last episode emesis was 3 days ago.  . Modifying factors: not taking the Omeprazole as prescribed - was only taking this for a few days then stopped . Assoc signs/symptoms: occasional heartburn, occasional sharp epigastric pain, no chest pain/SOB/dizziness   Past medical, social and family history reviewed: Past Medical History  Diagnosis Date  . Disorder of bone and cartilage 03/08/2015  . Osteoporosis 03/08/2015    previously on Fosamax, pt told to stop this due to dental procedure. Will repeat DEXA.    . Cardiac pacemaker in situ 03/08/2015    Placed for Tachy/Brady syndrome   . Cardiac risk counseling 03/08/2015    Await lipids ot calculate ASCVD risk score. No hx known MI/CVA   . Cough 03/08/2015  . History of glaucoma 03/08/2015  . History of skin cancer 03/08/2015  . History of tachycardia-bradycardia syndrome 03/08/2015  . Hyperlipidemia 03/08/2015  . Palpitations 03/08/2015    Pt concerned for Afib.CHA2DDS2-VASc Score 3.2%. Pt reports pacemaker check revealed AFib but I don't see Afib on PM reports, EKG sinus   . Postmenopausal 03/08/2015  . Gait instability 04/19/2015   Past Surgical History  Procedure Laterality Date  . Appendectomy    . Eye surgery      CATARACT  . Ovarian cancer  1997  . Joint replacement Bilateral     KNEE  . Cholecystectomy    . Mohs surgery  2015  . Tooth extraction    . Blood clot   2006  . Colon surgery  1998  . Abdominal hysterectomy  1979   Social History  Substance Use Topics  . Smoking status: Never Smoker   . Smokeless tobacco: Not on file  . Alcohol Use: No   Family History  Problem Relation Age of Onset  . Stroke Mother   . Heart disease Sister   . Dementia Brother   . Heart disease Brother     Current Outpatient Prescriptions  Medication Sig Dispense Refill  . apixaban (ELIQUIS) 2.5 MG TABS tablet Take 2.5 mg by mouth 2 (two) times daily.    . ARTIFICIAL TEARS 0.1-0.3 % SOLN Apply to eye as needed.     . Calcium Carbonate-Vitamin D 600-400 MG-UNIT chew tablet Chew 2 tablets by mouth daily. 60 tablet 12  . diclofenac (VOLTAREN) 75 MG EC tablet Take 1 tablet (75 mg total) by mouth 2 (two) times daily. 180 tablet 3  . Echinacea 125 MG CAPS Take by mouth. Take 200 mg by mouth daily    . ferrous sulfate 325 (65 FE) MG tablet Take 325 mg by mouth daily with breakfast.    . Flaxseed, Linseed, (FLAXSEED OIL PO) Take by mouth.    Marland Kitchen FLUZONE HIGH-DOSE 0.5 ML SUSY ADM 0.5ML IM UTD  0  . Garlic [id number] MG TABS Take by mouth. Take 1000 mg by mouth daily    .  Glucosamine Sulfate 500 MG TABS Take by mouth. Take 200 mg by mouth daily    . ipratropium (ATROVENT) 0.03 % nasal spray Place 2 sprays into both nostrils 3 (three) times daily. 30 mL 0  . latanoprost (XALATAN) 0.005 % ophthalmic solution 1 drop at bedtime.    Marland Kitchen loratadine (CLARITIN) 10 MG tablet Take 10 mg by mouth daily.    Marland Kitchen Lysine 500 MG CAPS Take by mouth. Take 1000 mg by mouth daily    . Melatonin 5 MG TABS Take by mouth. Take 5 mg by mouth daily    . Multiple Vitamin (MULTIVITAMIN) tablet Take 1 tablet by mouth daily.    Marland Kitchen omeprazole (PRILOSEC) 20 MG capsule Take 20 mg by mouth daily.    Marland Kitchen oxybutynin (DITROPAN) 5 MG tablet Take 1 tablet (5 mg total) by mouth 2 (two) times daily. 180 tablet 3  . PNEUMOVAX 23 25 MCG/0.5ML injection ADM 0.5ML IM UTD  0  . Policosanol 10 MG CAPS Take by mouth. Take by mouth  2 times daily    . Potassium 99 MG TABS Take by mouth.    . pyridOXINE (VITAMIN B-6) 100 MG tablet Take 100 mg by mouth daily.    . sucralfate (CARAFATE) 1 g tablet Take 1 tablet (1 g total) by mouth 4 (four) times daily. For 6 weeks. 120 tablet 1  . timolol (TIMOPTIC) 0.25 % ophthalmic solution 1 drop 2 (two) times daily.    . vitamin C (ASCORBIC ACID) 500 MG tablet Take 500 mg by mouth daily.     No current facility-administered medications for this visit.   Allergies  Allergen Reactions  . Keflex [Cephalexin]   . Tetracyclines & Related   . Valtrex [Valacyclovir Hcl]       Review of Systems: CONSTITUTIONAL:  No  fever, no chills, No recent illness, No unintentional weight changes HEAD/EYES/EARS/NOSE/THROAT: No  headache, no vision change,  CARDIAC: No  chest pain, No  pressure, No palpitations, No  orthopnea RESPIRATORY: No  cough, No  shortness of breath/wheeze GASTROINTESTINAL: (+) occasional nausea, (+) vomiting x 2 or 3 as per HPI, (+) intermiettent infrequent abdominal pain as per HPI, No  blood in stool, No  diarrhea, No  Constipation, no dysphagia, no early satiery, no weight changes NEUROLOGIC: No  weakness, No  dizziness,   Exam:  BP 123/61 mmHg  Pulse 83  Wt 141 lb (63.957 kg) Constitutional: VS see above. General Appearance: alert, well-developed, well-nourished, NAD Eyes: Normal lids and conjunctive, non-icteric sclera, Ears, Nose, Mouth, Throat: MMM, Normal external inspection ears/nares/mouth/lips/gums, Neck: No masses, trachea midline. No thyroid enlargement. No tenderness/mass appreciated. No lymphadenopathy Respiratory: Normal respiratory effort. no wheeze, no rhonchi, no rales Cardiovascular: S1/S2 normal, no murmur, no rub/gallop auscultated. RRR. No lower extremity edema. Gastrointestinal: Nontender, no masses. No hepatomegaly, no splenomegaly. No hernia appreciated. Bowel sounds normal. Rectal exam deferred.    No results found for this or any previous  visit (from the past 72 hour(s)).  No results found.   ASSESSMENT/PLAN:  Gastroesophageal reflux disease with esophagitis - advised regular use PPI as Rx, stop carafate and use prn Tums/Pepto for dyspepsia. If worse/change consider imaging/GI referral ?esoph spasm, hiatal hernia No alarm signs/sypmtoms for urgent referral.    All questions were answered. Visit summary with medication list and pertinent instructions was printed for patient to review - updated list as best I could, pt states all meds look accurate. ER/RTC precautions were reviewed with the patient. Return in about 4 months (  around 11/27/2015), or sooner if needed, for FOLLOW UP FOR BLOOD PRESSURE RECHECK AND ROUTINE CARE. Marland Kitchen

## 2015-07-28 NOTE — Patient Instructions (Addendum)
Take the OMEPRAZOLE every day, no matter what. This medicine helps prevent acid reflux problems. Avoid eating too close to bedtime, avoid acidic foods like coffee, orange juice, chocolate, tomatoes or other spicy foods. You can try sleeping with your head elevated on a few pillows, too. You can stop CARAFATE and take TUMS or PEPTO BISMOL as needed for acid reflux, nausea, or stomach discomfort.   If you start to have pain with swallowing, persistent vomiting, weight loss, or other concerns, please let us know. We might need to send you to GI specialist to look at the esophagus and stomach with a scope/camera.     Gastroesophageal Reflux Disease, Adult Normally, food travels down the esophagus and stays in the stomach to be digested. However, when a person has gastroesophageal reflux disease (GERD), food and stomach acid move back up into the esophagus. When this happens, the esophagus becomes sore and inflamed. Over time, GERD can create small holes (ulcers) in the lining of the esophagus.  CAUSES This condition is caused by a problem with the muscle between the esophagus and the stomach (lower esophageal sphincter, or LES). Normally, the LES muscle closes after food passes through the esophagus to the stomach. When the LES is weakened or abnormal, it does not close properly, and that allows food and stomach acid to go back up into the esophagus. The LES can be weakened by certain dietary substances, medicines, and medical conditions, including:  Tobacco use.  Pregnancy.  Having a hiatal hernia.  Heavy alcohol use.  Certain foods and beverages, such as coffee, chocolate, onions, and peppermint. RISK FACTORS This condition is more likely to develop in:  People who have an increased body weight.  People who have connective tissue disorders.  People who use NSAID medicines. SYMPTOMS Symptoms of this condition include:  Heartburn.  Difficult or painful swallowing.  The feeling of  having a lump in the throat.  Abitter taste in the mouth.  Bad breath.  Having a large amount of saliva.  Having an upset or bloated stomach.  Belching.  Chest pain.  Shortness of breath or wheezing.  Ongoing (chronic) cough or a night-time cough.  Wearing away of tooth enamel.  Weight loss. Different conditions can cause chest pain. Make sure to see your health care provider if you experience chest pain. DIAGNOSIS Your health care provider will take a medical history and perform a physical exam. To determine if you have mild or severe GERD, your health care provider may also monitor how you respond to treatment. You may also have other tests, including:  An endoscopy toexamine your stomach and esophagus with a small camera.  A test thatmeasures the acidity level in your esophagus.  A test thatmeasures how much pressure is on your esophagus.  A barium swallow or modified barium swallow to show the shape, size, and functioning of your esophagus. TREATMENT The goal of treatment is to help relieve your symptoms and to prevent complications. Treatment for this condition may vary depending on how severe your symptoms are. Your health care provider may recommend:  Changes to your diet.  Medicine.  Surgery. HOME CARE INSTRUCTIONS Diet  Follow a diet as recommended by your health care provider. This may involve avoiding foods and drinks such as:  Coffee and tea (with or without caffeine).  Drinks that containalcohol.  Energy drinks and sports drinks.  Carbonated drinks or sodas.  Chocolate and cocoa.  Peppermint and mint flavorings.  Garlic and onions.  Horseradish.  Spicy and  acidic foods, including peppers, chili powder, curry powder, vinegar, hot sauces, and barbecue sauce.  Citrus fruit juices and citrus fruits, such as oranges, lemons, and limes.  Tomato-based foods, such as red sauce, chili, salsa, and pizza with red sauce.  Fried and fatty foods,  such as donuts, french fries, potato chips, and high-fat dressings.  High-fat meats, such as hot dogs and fatty cuts of red and white meats, such as rib eye steak, sausage, ham, and bacon.  High-fat dairy items, such as whole milk, butter, and cream cheese.  Eat small, frequent meals instead of large meals.  Avoid drinking large amounts of liquid with your meals.  Avoid eating meals during the 2-3 hours before bedtime.  Avoid lying down right after you eat.  Do not exercise right after you eat. General Instructions  Pay attention to any changes in your symptoms.  Take over-the-counter and prescription medicines only as told by your health care provider. Do not take aspirin, ibuprofen, or other NSAIDs unless your health care provider told you to do so.  Do not use any tobacco products, including cigarettes, chewing tobacco, and e-cigarettes. If you need help quitting, ask your health care provider.  Wear loose-fitting clothing. Do not wear anything tight around your waist that causes pressure on your abdomen.  Raise (elevate) the head of your bed 6 inches (15cm).  Try to reduce your stress, such as with yoga or meditation. If you need help reducing stress, ask your health care provider.  If you are overweight, reduce your weight to an amount that is healthy for you. Ask your health care provider for guidance about a safe weight loss goal.  Keep all follow-up visits as told by your health care provider. This is important. SEEK MEDICAL CARE IF:  You have new symptoms.  You have unexplained weight loss.  You have difficulty swallowing, or it hurts to swallow.  You have wheezing or a persistent cough.  Your symptoms do not improve with treatment.  You have a hoarse voice. SEEK IMMEDIATE MEDICAL CARE IF:  You have pain in your arms, neck, jaw, teeth, or back.  You feel sweaty, dizzy, or light-headed.  You have chest pain or shortness of breath.  You vomit and your  vomit looks like blood or coffee grounds.  You faint.  Your stool is bloody or black.  You cannot swallow, drink, or eat.   This information is not intended to replace advice given to you by your health care provider. Make sure you discuss any questions you have with your health care provider.   Document Released: 11/14/2004 Document Revised: 10/26/2014 Document Reviewed: 06/01/2014 Elsevier Interactive Patient Education [employer].

## 2015-08-03 ENCOUNTER — Ambulatory Visit: Payer: Medicare Other | Admitting: Osteopathic Medicine

## 2015-09-06 DIAGNOSIS — H401131 Primary open-angle glaucoma, bilateral, mild stage: Secondary | ICD-10-CM | POA: Diagnosis not present

## 2015-10-24 ENCOUNTER — Ambulatory Visit: Payer: Medicare Other

## 2015-10-24 ENCOUNTER — Other Ambulatory Visit: Payer: Self-pay

## 2015-10-24 ENCOUNTER — Other Ambulatory Visit: Payer: Self-pay | Admitting: Osteopathic Medicine

## 2015-10-24 DIAGNOSIS — M810 Age-related osteoporosis without current pathological fracture: Secondary | ICD-10-CM | POA: Diagnosis not present

## 2015-10-25 LAB — COMPLETE METABOLIC PANEL WITH GFR
ALBUMIN: 3.6 g/dL (ref 3.6–5.1)
ALK PHOS: 40 U/L (ref 33–130)
ALT: 9 U/L (ref 6–29)
AST: 11 U/L (ref 10–35)
BILIRUBIN TOTAL: 0.4 mg/dL (ref 0.2–1.2)
BUN: 29 mg/dL — AB (ref 7–25)
CALCIUM: 8.2 mg/dL — AB (ref 8.6–10.4)
CO2: 21 mmol/L (ref 20–31)
Chloride: 106 mmol/L (ref 98–110)
Creat: 0.91 mg/dL — ABNORMAL HIGH (ref 0.60–0.88)
GFR, EST NON AFRICAN AMERICAN: 58 mL/min — AB (ref >=60)
GFR, Est African American: 67 mL/min (ref >=60)
GLUCOSE: 126 mg/dL — AB (ref 65–99)
Potassium: 4.1 mmol/L (ref 3.5–5.3)
SODIUM: 137 mmol/L (ref 135–146)
TOTAL PROTEIN: 5.9 g/dL — AB (ref 6.1–8.1)

## 2015-10-26 ENCOUNTER — Other Ambulatory Visit: Payer: Self-pay | Admitting: *Deleted

## 2015-10-26 NOTE — Addendum Note (Signed)
Addended by: Maryla Morrow on: 10/26/2015 04:24 PM   Modules accepted: Orders

## 2015-10-27 ENCOUNTER — Ambulatory Visit: Payer: Medicare Other

## 2015-10-27 LAB — MAGNESIUM: MAGNESIUM: 1.6 mg/dL (ref 1.5–2.5)

## 2015-10-27 LAB — VITAMIN D 25 HYDROXY (VIT D DEFICIENCY, FRACTURES): Vit D, 25-Hydroxy: 41 ng/mL (ref 30–100)

## 2015-10-30 LAB — PTH, INTACT AND CALCIUM
Calcium: 8.7 mg/dL (ref 8.6–10.4)
PTH: 29 pg/mL (ref 14–64)

## 2015-10-30 LAB — VITAMIN D 1,25 DIHYDROXY
VITAMIN D 1, 25 (OH) TOTAL: 23 pg/mL (ref 18–72)
VITAMIN D3 1, 25 (OH): 23 pg/mL

## 2015-11-03 DIAGNOSIS — I495 Sick sinus syndrome: Secondary | ICD-10-CM | POA: Diagnosis not present

## 2015-11-03 DIAGNOSIS — Z950 Presence of cardiac pacemaker: Secondary | ICD-10-CM | POA: Diagnosis not present

## 2015-11-07 ENCOUNTER — Ambulatory Visit (INDEPENDENT_AMBULATORY_CARE_PROVIDER_SITE_OTHER): Payer: Medicare Other | Admitting: Osteopathic Medicine

## 2015-11-07 VITALS — BP 114/56 | HR 98

## 2015-11-07 DIAGNOSIS — M810 Age-related osteoporosis without current pathological fracture: Secondary | ICD-10-CM | POA: Diagnosis not present

## 2015-11-07 MED ORDER — DENOSUMAB 60 MG/ML ~~LOC~~ SOLN
60.0000 mg | Freq: Once | SUBCUTANEOUS | Status: AC
  Administered 2015-11-07: 60 mg via SUBCUTANEOUS

## 2015-11-07 NOTE — Progress Notes (Signed)
Amanda Travis presents to the clinic for a prolia injection.  Her last PTH, intact and calcium done on 10/26/15 was within normal range.  Pt tolerated injection well in the subcutaneous in the left arm.  Pt advised to get lab work done and make next appointment in 6 months for next prolia injection. -[facility]MA

## 2015-11-07 NOTE — Progress Notes (Signed)
Nurse notes reviewed, no concerns

## 2015-11-27 ENCOUNTER — Ambulatory Visit: Payer: Medicare Other | Admitting: Osteopathic Medicine

## 2015-11-28 DIAGNOSIS — Z08 Encounter for follow-up examination after completed treatment for malignant neoplasm: Secondary | ICD-10-CM | POA: Diagnosis not present

## 2015-11-28 DIAGNOSIS — Z85828 Personal history of other malignant neoplasm of skin: Secondary | ICD-10-CM | POA: Diagnosis not present

## 2015-11-30 ENCOUNTER — Ambulatory Visit: Payer: Medicare Other | Admitting: Osteopathic Medicine

## 2015-12-07 ENCOUNTER — Ambulatory Visit (INDEPENDENT_AMBULATORY_CARE_PROVIDER_SITE_OTHER): Payer: Medicare Other | Admitting: Osteopathic Medicine

## 2015-12-07 ENCOUNTER — Encounter: Payer: Self-pay | Admitting: Osteopathic Medicine

## 2015-12-07 VITALS — BP 140/60 | HR 72 | Ht 61.0 in | Wt 142.0 lb

## 2015-12-07 DIAGNOSIS — E7800 Pure hypercholesterolemia, unspecified: Secondary | ICD-10-CM | POA: Diagnosis not present

## 2015-12-07 DIAGNOSIS — M810 Age-related osteoporosis without current pathological fracture: Secondary | ICD-10-CM | POA: Diagnosis not present

## 2015-12-07 DIAGNOSIS — R739 Hyperglycemia, unspecified: Secondary | ICD-10-CM

## 2015-12-07 DIAGNOSIS — Z950 Presence of cardiac pacemaker: Secondary | ICD-10-CM | POA: Diagnosis not present

## 2015-12-07 DIAGNOSIS — Z780 Asymptomatic menopausal state: Secondary | ICD-10-CM | POA: Diagnosis not present

## 2015-12-07 LAB — CBC WITH DIFFERENTIAL/PLATELET
BASOS PCT: 0 %
Basophils Absolute: 0 cells/uL (ref 0–200)
EOS PCT: 2 %
Eosinophils Absolute: 158 cells/uL (ref 15–500)
HCT: 20.9 % — ABNORMAL LOW (ref 35.0–45.0)
Hemoglobin: 5.7 g/dL — CL (ref 11.7–15.5)
Lymphocytes Relative: 12 %
Lymphs Abs: 948 cells/uL (ref 850–3900)
MCH: 20.9 pg — AB (ref 27.0–33.0)
MCHC: 27.3 g/dL — ABNORMAL LOW (ref 32.0–36.0)
MCV: 76.6 fL — ABNORMAL LOW (ref 80.0–100.0)
MONOS PCT: 10 %
MPV: 9 fL (ref 7.5–12.5)
Monocytes Absolute: 790 cells/uL (ref 200–950)
NEUTROS ABS: 6004 {cells}/uL (ref 1500–7800)
Neutrophils Relative %: 76 %
PLATELETS: 299 10*3/uL (ref 140–400)
RBC: 2.73 MIL/uL — ABNORMAL LOW (ref 3.80–5.10)
RDW: 16.4 % — ABNORMAL HIGH (ref 11.0–15.0)
WBC: 7.9 10*3/uL (ref 3.8–10.8)

## 2015-12-07 NOTE — Progress Notes (Signed)
HPI: Amanda Travis is a 82 y.o. female  who presents to [facility] today, 12/07/15,  for chief complaint of:  Chief Complaint  Patient presents with  . Follow-up    BLOOD PRESSURE    Patient states she is here for follow-up on blood pressure. No hypertension medications. Manual blood pressure is rechecked and higher than on automatic cuff, reading still acceptable range. Diastolic is a little bit low but patient is complaining of dizziness, shortness of breath.  Patient has some questions about her medication list. Wondering what ascorbic acid is, I explained it is vitamin C. Asked to have other medications taken off of her list, namely flu and pneumonia vaccines. This seems reasonable to me.  Patient has pacemaker in place. On Eliquis for paroxysmal atrial fibrillation. Following up annually with cardiology, next appointment is in February.  Was some concern for hypocalcemia, CMP was checked prior to most recent Prolene injection, on recheck calcium levels were normal.    Past medical, surgical, social and family history reviewed: Past Medical History:  Diagnosis Date  . Cardiac pacemaker in situ 03/08/2015   Placed for Tachy/Brady syndrome   . Cardiac risk counseling 03/08/2015   Await lipids ot calculate ASCVD risk score. No hx known MI/CVA   . Cough 03/08/2015  . Disorder of bone and cartilage 03/08/2015  . Gait instability 04/19/2015  . History of glaucoma 03/08/2015  . History of skin cancer 03/08/2015  . History of tachycardia-bradycardia syndrome 03/08/2015  . Hyperlipidemia 03/08/2015  . Osteoporosis 03/08/2015   previously on Fosamax, pt told to stop this due to dental procedure. Will repeat DEXA.    Marland Kitchen Palpitations 03/08/2015   Pt concerned for Afib.CHA2DDS2-VASc Score 3.2%. Pt reports pacemaker check revealed AFib but I don't see Afib on PM reports, EKG sinus   . Postmenopausal 03/08/2015   Past Surgical History:  Procedure Laterality Date  .  ABDOMINAL HYSTERECTOMY  1979  . APPENDECTOMY    . BLOOD CLOT  2006  . CHOLECYSTECTOMY    . COLON SURGERY  1998  . EYE SURGERY     CATARACT  . JOINT REPLACEMENT Bilateral    KNEE  . MOHS SURGERY  2015  . OVARIAN CANCER  1997  . TOOTH EXTRACTION     Social History  Substance Use Topics  . Smoking status: Never Smoker  . Smokeless tobacco: Not on file  . Alcohol use No   Family History  Problem Relation Age of Onset  . Stroke Mother   . Heart disease Sister   . Dementia Brother   . Heart disease Brother      Current medication list and allergy/intolerance information reviewed:   Current Outpatient Prescriptions  Medication Sig Dispense Refill  . apixaban (ELIQUIS) 2.5 MG TABS tablet Take 2.5 mg by mouth 2 (two) times daily.    . ARTIFICIAL TEARS 0.1-0.3 % SOLN Apply to eye as needed.     . Calcium Carbonate-Vitamin D 600-400 MG-UNIT chew tablet Chew 2 tablets by mouth daily. 60 tablet 12  . diclofenac (VOLTAREN) 75 MG EC tablet Take 1 tablet (75 mg total) by mouth 2 (two) times daily. 180 tablet 3  . Echinacea 125 MG CAPS Take by mouth. Take 200 mg by mouth daily    . ferrous sulfate 325 (65 FE) MG tablet Take 325 mg by mouth daily with breakfast.    . Flaxseed, Linseed, (FLAXSEED OIL PO) Take by mouth.    Marland Kitchen FLUZONE HIGH-DOSE 0.5 ML SUSY  ADM 0.5ML IM UTD  0  . Garlic [id number] MG TABS Take by mouth. Take 1000 mg by mouth daily    . Glucosamine Sulfate 500 MG TABS Take by mouth. Take 200 mg by mouth daily    . ipratropium (ATROVENT) 0.03 % nasal spray Place 2 sprays into both nostrils 3 (three) times daily. 30 mL 0  . latanoprost (XALATAN) 0.005 % ophthalmic solution 1 drop at bedtime.    Marland Kitchen loratadine (CLARITIN) 10 MG tablet Take 10 mg by mouth daily.    Marland Kitchen Lysine 500 MG CAPS Take by mouth. Take 1000 mg by mouth daily    . Melatonin 5 MG TABS Take by mouth. Take 5 mg by mouth daily    . Multiple Vitamin (MULTIVITAMIN) tablet Take 1 tablet by mouth daily.    Marland Kitchen omeprazole (PRILOSEC)  20 MG capsule Take 20 mg by mouth daily.    Marland Kitchen oxybutynin (DITROPAN) 5 MG tablet Take 1 tablet (5 mg total) by mouth 2 (two) times daily. 180 tablet 3  . PNEUMOVAX 23 25 MCG/0.5ML injection ADM 0.5ML IM UTD  0  . Policosanol 10 MG CAPS Take by mouth. Take by mouth 2 times daily    . Potassium 99 MG TABS Take by mouth.    . pyridOXINE (VITAMIN B-6) 100 MG tablet Take 100 mg by mouth daily.    . timolol (TIMOPTIC) 0.25 % ophthalmic solution 1 drop 2 (two) times daily.    . vitamin C (ASCORBIC ACID) 500 MG tablet Take 500 mg by mouth daily.     No current facility-administered medications for this visit.    Allergies  Allergen Reactions  . Keflex [Cephalexin]   . Tetracyclines & Related   . Valtrex [Valacyclovir Hcl]       Review of Systems:  Constitutional:  No  fever, no chills, No recent illness  HEENT: No  headache  Cardiac: No  chest pain, No  pressure, No palpitations  Respiratory:  No  shortness of breath.   Gastrointestinal: No  abdominal pain,   Neurologic: No  weakness, No  dizziness   Exam:  BP 140/60   Pulse 72   Ht 5\' 1"  (1.549 m)   Wt 142 lb (64.4 kg)   BMI 26.83 kg/m   Constitutional: VS see above. General Appearance: alert, well-developed, well-nourished, NAD  Ears, Nose, Mouth, Throat: MMM, Normal external inspection ears/nares/mouth/lips/gums.  Neck: No masses, trachea midline.   Respiratory: Normal respiratory effort. no wheeze, no rhonchi, no rales  Cardiovascular: S1/S2 normal, no murmur, no rub/gallop auscultated. RRR. No lower extremity edema.   Gastrointestinal: Nontender, no masses.   Musculoskeletal: Gait normal. They relate steadily with assistance of walker.  Neurological: Normal balance/coordination. No tremor. No cranial nerve deficit on limited exam. Motor intact and symmetric.   Skin: warm, dry, intact.   Psychiatric: Normal judgment/insight. Normal mood and affect. Oriented x3.      ASSESSMENT/PLAN: Nehemiah Settle time answering  all questions about medication list. Patient should not have to get calcium levels checked prior to every probably injection since she is not renally impaired or other malabsorptive syndrome. We'll go ahead and recheck the calcium at this point as long as it's looking okay, can consider recheck annually versus every 6 months.  Age-related osteoporosis without current pathological fracture - Plan: CBC with Differential/Platelet, COMPLETE METABOLIC PANEL WITH GFR, TSH, VITAMIN D 25 Hydroxy (Vit-D Deficiency, Fractures), Calcium, ionized  Pure hypercholesterolemia - Plan: Lipid panel  Cardiac pacemaker in situ  Postmenopausal  Elevated  blood sugar - Plan: Hemoglobin A1c     Visit summary with medication list and pertinent instructions was printed for patient to review. All questions at time of visit were answered - patient instructed to contact office with any additional concerns. ER/RTC precautions were reviewed with the patient. Follow-up plan: Return in about 6 months (around 06/06/2016) for [place] .  Note: Total time spent 25 minutes, greater than 50% of the visit was spent face-to-face counseling and coordinating care for the following: The primary encounter diagnosis was Age-related osteoporosis without current pathological fracture. Diagnoses of Pure hypercholesterolemia, Cardiac pacemaker in situ, Postmenopausal, and Elevated blood sugar were also pertinent to this visit.Marland Kitchen

## 2015-12-08 DIAGNOSIS — D500 Iron deficiency anemia secondary to blood loss (chronic): Secondary | ICD-10-CM | POA: Diagnosis not present

## 2015-12-08 DIAGNOSIS — D649 Anemia, unspecified: Secondary | ICD-10-CM | POA: Diagnosis not present

## 2015-12-08 DIAGNOSIS — Z79899 Other long term (current) drug therapy: Secondary | ICD-10-CM | POA: Diagnosis not present

## 2015-12-08 DIAGNOSIS — R195 Other fecal abnormalities: Secondary | ICD-10-CM | POA: Diagnosis not present

## 2015-12-08 DIAGNOSIS — M069 Rheumatoid arthritis, unspecified: Secondary | ICD-10-CM | POA: Diagnosis not present

## 2015-12-08 DIAGNOSIS — K2950 Unspecified chronic gastritis without bleeding: Secondary | ICD-10-CM | POA: Diagnosis not present

## 2015-12-08 DIAGNOSIS — K440 Diaphragmatic hernia with obstruction, without gangrene: Secondary | ICD-10-CM | POA: Diagnosis not present

## 2015-12-08 DIAGNOSIS — D509 Iron deficiency anemia, unspecified: Secondary | ICD-10-CM | POA: Insufficient documentation

## 2015-12-08 DIAGNOSIS — Z950 Presence of cardiac pacemaker: Secondary | ICD-10-CM | POA: Diagnosis not present

## 2015-12-08 DIAGNOSIS — Z7901 Long term (current) use of anticoagulants: Secondary | ICD-10-CM | POA: Diagnosis not present

## 2015-12-08 DIAGNOSIS — R079 Chest pain, unspecified: Secondary | ICD-10-CM | POA: Diagnosis not present

## 2015-12-08 DIAGNOSIS — K2970 Gastritis, unspecified, without bleeding: Secondary | ICD-10-CM | POA: Diagnosis not present

## 2015-12-08 DIAGNOSIS — Z888 Allergy status to other drugs, medicaments and biological substances status: Secondary | ICD-10-CM | POA: Diagnosis not present

## 2015-12-08 DIAGNOSIS — D122 Benign neoplasm of ascending colon: Secondary | ICD-10-CM | POA: Diagnosis not present

## 2015-12-08 DIAGNOSIS — K648 Other hemorrhoids: Secondary | ICD-10-CM | POA: Diagnosis present

## 2015-12-08 DIAGNOSIS — M1990 Unspecified osteoarthritis, unspecified site: Secondary | ICD-10-CM | POA: Diagnosis not present

## 2015-12-08 DIAGNOSIS — K635 Polyp of colon: Secondary | ICD-10-CM | POA: Diagnosis present

## 2015-12-08 DIAGNOSIS — D62 Acute posthemorrhagic anemia: Secondary | ICD-10-CM | POA: Diagnosis not present

## 2015-12-08 DIAGNOSIS — I482 Chronic atrial fibrillation: Secondary | ICD-10-CM | POA: Diagnosis not present

## 2015-12-08 DIAGNOSIS — I517 Cardiomegaly: Secondary | ICD-10-CM | POA: Diagnosis not present

## 2015-12-08 DIAGNOSIS — K219 Gastro-esophageal reflux disease without esophagitis: Secondary | ICD-10-CM | POA: Diagnosis not present

## 2015-12-08 DIAGNOSIS — K449 Diaphragmatic hernia without obstruction or gangrene: Secondary | ICD-10-CM | POA: Diagnosis present

## 2015-12-08 DIAGNOSIS — Z883 Allergy status to other anti-infective agents status: Secondary | ICD-10-CM | POA: Diagnosis not present

## 2015-12-08 DIAGNOSIS — I4891 Unspecified atrial fibrillation: Secondary | ICD-10-CM | POA: Insufficient documentation

## 2015-12-08 DIAGNOSIS — C569 Malignant neoplasm of unspecified ovary: Secondary | ICD-10-CM | POA: Diagnosis not present

## 2015-12-08 DIAGNOSIS — K259 Gastric ulcer, unspecified as acute or chronic, without hemorrhage or perforation: Secondary | ICD-10-CM | POA: Diagnosis present

## 2015-12-08 DIAGNOSIS — I480 Paroxysmal atrial fibrillation: Secondary | ICD-10-CM | POA: Diagnosis not present

## 2015-12-08 DIAGNOSIS — K6389 Other specified diseases of intestine: Secondary | ICD-10-CM | POA: Diagnosis not present

## 2015-12-08 DIAGNOSIS — K922 Gastrointestinal hemorrhage, unspecified: Secondary | ICD-10-CM | POA: Diagnosis not present

## 2015-12-08 LAB — COMPLETE METABOLIC PANEL WITH GFR
ALT: 8 U/L (ref 6–29)
AST: 11 U/L (ref 10–35)
Albumin: 3.5 g/dL — ABNORMAL LOW (ref 3.6–5.1)
Alkaline Phosphatase: 46 U/L (ref 33–130)
BILIRUBIN TOTAL: 0.3 mg/dL (ref 0.2–1.2)
BUN: 33 mg/dL — ABNORMAL HIGH (ref 7–25)
CO2: 21 mmol/L (ref 20–31)
Calcium: 8.5 mg/dL — ABNORMAL LOW (ref 8.6–10.4)
Chloride: 107 mmol/L (ref 98–110)
Creat: 0.78 mg/dL (ref 0.60–0.88)
GFR, EST NON AFRICAN AMERICAN: 70 mL/min (ref >=60)
GFR, Est African American: 81 mL/min (ref >=60)
GLUCOSE: 123 mg/dL — AB (ref 65–99)
Potassium: 4 mmol/L (ref 3.5–5.3)
SODIUM: 138 mmol/L (ref 135–146)
TOTAL PROTEIN: 6 g/dL — AB (ref 6.1–8.1)

## 2015-12-08 LAB — TSH: TSH: 4.02 mIU/L

## 2015-12-08 LAB — LIPID PANEL
CHOL/HDL RATIO: 3.1 ratio (ref <=5.0)
CHOLESTEROL: 171 mg/dL (ref 125–200)
HDL: 55 mg/dL (ref >=46)
LDL Cholesterol: 71 mg/dL (ref <130)
Triglycerides: 226 mg/dL — ABNORMAL HIGH (ref <150)
VLDL: 45 mg/dL — AB (ref <30)

## 2015-12-08 LAB — HEMOGLOBIN A1C
Hgb A1c MFr Bld: 4.6 % (ref <5.7)
Mean Plasma Glucose: 85 mg/dL

## 2015-12-08 LAB — VITAMIN D 25 HYDROXY (VIT D DEFICIENCY, FRACTURES): VIT D 25 HYDROXY: 35 ng/mL (ref 30–100)

## 2015-12-08 LAB — CALCIUM, IONIZED: Calcium, Ion: 4.8 mg/dL (ref 4.8–5.6)

## 2015-12-10 DIAGNOSIS — K259 Gastric ulcer, unspecified as acute or chronic, without hemorrhage or perforation: Secondary | ICD-10-CM | POA: Insufficient documentation

## 2015-12-20 ENCOUNTER — Inpatient Hospital Stay: Payer: Medicare Other | Admitting: Sports Medicine

## 2016-01-02 ENCOUNTER — Ambulatory Visit (INDEPENDENT_AMBULATORY_CARE_PROVIDER_SITE_OTHER): Payer: Medicare Other | Admitting: Osteopathic Medicine

## 2016-01-02 ENCOUNTER — Encounter: Payer: Self-pay | Admitting: Osteopathic Medicine

## 2016-01-02 VITALS — BP 145/75 | HR 64 | Ht 61.0 in | Wt 135.0 lb

## 2016-01-02 DIAGNOSIS — Z09 Encounter for follow-up examination after completed treatment for conditions other than malignant neoplasm: Secondary | ICD-10-CM

## 2016-01-02 DIAGNOSIS — K922 Gastrointestinal hemorrhage, unspecified: Secondary | ICD-10-CM

## 2016-01-02 DIAGNOSIS — M1990 Unspecified osteoarthritis, unspecified site: Secondary | ICD-10-CM | POA: Diagnosis not present

## 2016-01-02 NOTE — Patient Instructions (Addendum)
   Can use Tylenol for pain - this will not increase your risk of bleeding. Can take 500 mg 4 times per day.   If severe arthritis pain, can use occasional Aleve or Ibuprofen over-the-counter, but these can raise bleeding risk a bit, so be cautious.

## 2016-01-02 NOTE — Progress Notes (Signed)
HPI: Amanda Travis is a 82 y.o. female  who presents to [facility] today, 01/02/16,  for chief complaint of:  Chief Complaint  Patient presents with  . Hospitalization Follow-up    GI Bleeding    Recently treated in the hospital for GI bleed. Admitted 12/08/2015, discharge 12/12/2015. Contribute in factors include: Anticoagulation for paroxysmal A. fib, NSAID use for rheumatoid. Hemoglobin initially noted to be 5.7. Hemoccult was positive. Patient underwent colonoscopy and EGD. Blood transfusions and IV iron received. Hemoglobin 8.3 on discharge. Eliquis was continued. Protonix initiated. Diclofenac was discontinued.  Overall, patient is doing well since hospital discharge. No weakness/dizziness. No chest pain or difficulty breathing. Reports some occasional coldness in her extremities. Arthritis pain on and off, she has stopped the diclofenac as noted above.    Past medical, surgical, social and family history reviewed: Patient Active Problem List   Diagnosis Date Noted  . Intractable cyclical vomiting with nausea 06/23/2015  . Gastroesophageal reflux disease with esophagitis 06/23/2015  . Gait instability 04/19/2015  . Cardiac pacemaker in situ 03/08/2015  . Cough 03/08/2015  . History of glaucoma 03/08/2015  . Palpitations 03/08/2015  . Cardiac risk counseling 03/08/2015  . History of tachycardia-bradycardia syndrome 03/08/2015  . Postmenopausal 03/08/2015  . Osteoporosis 03/08/2015  . Disorder of bone and cartilage 03/08/2015  . Hyperlipidemia 03/08/2015  . History of skin cancer 03/08/2015   Past Surgical History:  Procedure Laterality Date  . ABDOMINAL HYSTERECTOMY  1979  . APPENDECTOMY    . BLOOD CLOT  2006  . CHOLECYSTECTOMY    . COLON SURGERY  1998  . EYE SURGERY     CATARACT  . JOINT REPLACEMENT Bilateral    KNEE  . MOHS SURGERY  2015  . OVARIAN CANCER  1997  . TOOTH EXTRACTION     Social History  Substance Use Topics  .  Smoking status: Never Smoker  . Smokeless tobacco: Not on file  . Alcohol use No   Family History  Problem Relation Age of Onset  . Stroke Mother   . Heart disease Sister   . Dementia Brother   . Heart disease Brother      Current medication list and allergy/intolerance information reviewed:   Current Outpatient Prescriptions  Medication Sig Dispense Refill  . apixaban (ELIQUIS) 2.5 MG TABS tablet Take 2.5 mg by mouth 2 (two) times daily.    . ARTIFICIAL TEARS 0.1-0.3 % SOLN Apply to eye as needed.     . Calcium Carbonate-Vitamin D 600-400 MG-UNIT chew tablet Chew 2 tablets by mouth daily. 60 tablet 12  . diclofenac (VOLTAREN) 75 MG EC tablet Take 1 tablet (75 mg total) by mouth 2 (two) times daily. 180 tablet 3  . Echinacea 125 MG CAPS Take by mouth. Take 200 mg by mouth daily    . ferrous sulfate 325 (65 FE) MG tablet Take 325 mg by mouth daily with breakfast.    . Flaxseed, Linseed, (FLAXSEED OIL PO) Take by mouth.    . Garlic XX123456 MG TABS Take by mouth. Take 1000 mg by mouth daily    . Glucosamine Sulfate 500 MG TABS Take by mouth. Take 200 mg by mouth daily    . latanoprost (XALATAN) 0.005 % ophthalmic solution 1 drop at bedtime.    Marland Kitchen loratadine (CLARITIN) 10 MG tablet Take 10 mg by mouth daily.    Marland Kitchen Lysine 500 MG CAPS Take by mouth. Take 1000 mg by mouth daily    . Melatonin  5 MG TABS Take by mouth. Take 5 mg by mouth daily    . Multiple Vitamin (MULTIVITAMIN) tablet Take 1 tablet by mouth daily.    Marland Kitchen oxybutynin (DITROPAN) 5 MG tablet Take 1 tablet (5 mg total) by mouth 2 (two) times daily. 180 tablet 3  . Policosanol 10 MG CAPS Take by mouth. Take by mouth 2 times daily    . Potassium 99 MG TABS Take by mouth.    . pyridOXINE (VITAMIN B-6) 100 MG tablet Take 100 mg by mouth daily.    . timolol (TIMOPTIC) 0.25 % ophthalmic solution 1 drop 2 (two) times daily.    . vitamin C (ASCORBIC ACID) 500 MG tablet Take 500 mg by mouth daily.     No current facility-administered  medications for this visit.    Allergies  Allergen Reactions  . Keflex [Cephalexin]   . Tetracyclines & Related   . Valtrex [Valacyclovir Hcl]       Review of Systems:  Constitutional:  No  fever, no chills, No recent illness  Cardiac: No  chest pain, No  pressure  Respiratory:  No  shortness of breath.  Gastrointestinal: No  abdominal pain, No  nausea, No  vomiting,  No  blood in stool,   Musculoskeletal: No new myalgia/arthralgia, +hand pain as per HPI  Skin: No  Rash  Hem/Onc: No  easy bruising/bleeding  Endocrine: +cold intolerance,  Neurologic: No  weakness, No  dizziness   Exam:  BP (!) 145/75   Pulse 64   Ht 5\' 1"  (1.549 m)   Wt 135 lb (61.2 kg)   BMI 25.51 kg/m   Constitutional: VS see above. General Appearance: alert, well-developed, well-nourished, NAD  Eyes: Normal lids and conjunctive, non-icteric sclera  Ears, Nose, Mouth, Throat: MMM, Normal external inspection ears/nares/mouth/lips/gums.   Neck: No masses, trachea midline. No thyroid enlargement  Respiratory: Normal respiratory effort. no wheeze, no rhonchi, no rales  Cardiovascular: S1/S2 normal, no murmur, no rub/gallop auscultated. RRR. No lower extremity edema.   Gastrointestinal: Nontender, no masses. No hepatomegaly, no splenomegaly.   Musculoskeletal: Gait normal. No clubbing/cyanosis of digits. ?bouchard's nodes vs spurs, no significant boutinniere or swan neck deformity and no ulnar deviation  Neurological: Normal balance/coordination.   Skin: warm, dry, intact  Psychiatric: Normal judgment/insight. Normal mood and affect. Oriented x3.      ASSESSMENT/PLAN: Questionable rheumatoid versus osteoarthritis. Ideally, avoid NSAIDs given GI bleed and she is also on anticoagulation. She states pain not bothering her too much just now. Inflammatory markers as below for preliminary RA workup, may benefit from those therapies to help control pain if suspicion for RA.   Hospital discharge  follow-up  Gastrointestinal hemorrhage, unspecified gastrointestinal hemorrhage type - Plan: CBC with Differential/Platelet, COMPLETE METABOLIC PANEL WITH GFR  Arthritis - Plan: Rheumatoid factor, Sedimentation rate    Patient Instructions   Can use Tylenol for pain - this will not increase your risk of bleeding. Can take 500 mg 4 times per day.   If severe arthritis pain, can use occasional Aleve or Ibuprofen over-the-counter, but these can raise bleeding risk a bit, so be cautious.       Visit summary with medication list and pertinent instructions was printed for patient to review. All questions at time of visit were answered - patient instructed to contact office with any additional concerns. ER/RTC precautions were reviewed with the patient. Follow-up plan: Return in about 3 months (around 04/03/2016) for BLOOD PRESSURE FOLLOWUP, AND ANNUAL PHYSICAL WHEN DUE.

## 2016-01-03 LAB — CBC WITH DIFFERENTIAL/PLATELET
BASOS ABS: 74 {cells}/uL (ref 0–200)
BASOS PCT: 1 %
EOS PCT: 2 %
Eosinophils Absolute: 148 cells/uL (ref 15–500)
HCT: 38.7 % (ref 35.0–45.0)
HEMOGLOBIN: 11.9 g/dL (ref 11.7–15.5)
LYMPHS ABS: 888 {cells}/uL (ref 850–3900)
Lymphocytes Relative: 12 %
MCH: 26.4 pg — AB (ref 27.0–33.0)
MCHC: 30.7 g/dL — AB (ref 32.0–36.0)
MCV: 85.8 fL (ref 80.0–100.0)
MONOS PCT: 11 %
MPV: 9.1 fL (ref 7.5–12.5)
Monocytes Absolute: 814 cells/uL (ref 200–950)
NEUTROS PCT: 74 %
Neutro Abs: 5476 cells/uL (ref 1500–7800)
PLATELETS: 278 10*3/uL (ref 140–400)
RBC: 4.51 MIL/uL (ref 3.80–5.10)
WBC: 7.4 10*3/uL (ref 3.8–10.8)

## 2016-01-03 LAB — COMPLETE METABOLIC PANEL WITH GFR
ALK PHOS: 63 U/L (ref 33–130)
ALT: 9 U/L (ref 6–29)
AST: 14 U/L (ref 10–35)
Albumin: 3.9 g/dL (ref 3.6–5.1)
BUN: 22 mg/dL (ref 7–25)
CHLORIDE: 104 mmol/L (ref 98–110)
CO2: 25 mmol/L (ref 20–31)
Calcium: 9.2 mg/dL (ref 8.6–10.4)
Creat: 0.85 mg/dL (ref 0.60–0.88)
GFR, EST NON AFRICAN AMERICAN: 63 mL/min (ref >=60)
GFR, Est African American: 73 mL/min (ref >=60)
GLUCOSE: 111 mg/dL — AB (ref 65–99)
POTASSIUM: 4.2 mmol/L (ref 3.5–5.3)
SODIUM: 140 mmol/L (ref 135–146)
Total Bilirubin: 0.4 mg/dL (ref 0.2–1.2)
Total Protein: 6.8 g/dL (ref 6.1–8.1)

## 2016-01-03 LAB — SEDIMENTATION RATE: SED RATE: 15 mm/h (ref 0–30)

## 2016-01-03 LAB — RHEUMATOID FACTOR: Rhuematoid fact SerPl-aCnc: <14 IU/mL (ref <14)

## 2016-01-04 DIAGNOSIS — Z86010 Personal history of colonic polyps: Secondary | ICD-10-CM | POA: Diagnosis not present

## 2016-01-04 DIAGNOSIS — D509 Iron deficiency anemia, unspecified: Secondary | ICD-10-CM | POA: Diagnosis not present

## 2016-01-17 DIAGNOSIS — D500 Iron deficiency anemia secondary to blood loss (chronic): Secondary | ICD-10-CM | POA: Diagnosis not present

## 2016-02-07 DIAGNOSIS — Z86010 Personal history of colonic polyps: Secondary | ICD-10-CM | POA: Diagnosis not present

## 2016-02-07 DIAGNOSIS — D509 Iron deficiency anemia, unspecified: Secondary | ICD-10-CM | POA: Diagnosis not present

## 2016-02-07 DIAGNOSIS — R109 Unspecified abdominal pain: Secondary | ICD-10-CM | POA: Diagnosis not present

## 2016-02-08 ENCOUNTER — Encounter: Payer: Self-pay | Admitting: Osteopathic Medicine

## 2016-02-13 DIAGNOSIS — Z950 Presence of cardiac pacemaker: Secondary | ICD-10-CM | POA: Diagnosis not present

## 2016-03-15 DIAGNOSIS — H401132 Primary open-angle glaucoma, bilateral, moderate stage: Secondary | ICD-10-CM | POA: Diagnosis not present

## 2016-03-27 DIAGNOSIS — M9901 Segmental and somatic dysfunction of cervical region: Secondary | ICD-10-CM | POA: Diagnosis not present

## 2016-03-27 DIAGNOSIS — M9902 Segmental and somatic dysfunction of thoracic region: Secondary | ICD-10-CM | POA: Diagnosis not present

## 2016-03-27 DIAGNOSIS — M791 Myalgia: Secondary | ICD-10-CM | POA: Diagnosis not present

## 2016-03-27 DIAGNOSIS — M545 Low back pain: Secondary | ICD-10-CM | POA: Diagnosis not present

## 2016-03-27 DIAGNOSIS — M9904 Segmental and somatic dysfunction of sacral region: Secondary | ICD-10-CM | POA: Diagnosis not present

## 2016-03-27 DIAGNOSIS — M9903 Segmental and somatic dysfunction of lumbar region: Secondary | ICD-10-CM | POA: Diagnosis not present

## 2016-04-01 DIAGNOSIS — M9903 Segmental and somatic dysfunction of lumbar region: Secondary | ICD-10-CM | POA: Diagnosis not present

## 2016-04-01 DIAGNOSIS — M9904 Segmental and somatic dysfunction of sacral region: Secondary | ICD-10-CM | POA: Diagnosis not present

## 2016-04-01 DIAGNOSIS — M9901 Segmental and somatic dysfunction of cervical region: Secondary | ICD-10-CM | POA: Diagnosis not present

## 2016-04-01 DIAGNOSIS — M791 Myalgia: Secondary | ICD-10-CM | POA: Diagnosis not present

## 2016-04-01 DIAGNOSIS — M545 Low back pain: Secondary | ICD-10-CM | POA: Diagnosis not present

## 2016-04-01 DIAGNOSIS — M9902 Segmental and somatic dysfunction of thoracic region: Secondary | ICD-10-CM | POA: Diagnosis not present

## 2016-04-02 ENCOUNTER — Ambulatory Visit (INDEPENDENT_AMBULATORY_CARE_PROVIDER_SITE_OTHER): Payer: Medicare Other | Admitting: Osteopathic Medicine

## 2016-04-02 ENCOUNTER — Encounter: Payer: Self-pay | Admitting: Osteopathic Medicine

## 2016-04-02 VITALS — BP 145/75 | HR 66 | Ht 61.0 in | Wt 139.0 lb

## 2016-04-02 DIAGNOSIS — D649 Anemia, unspecified: Secondary | ICD-10-CM | POA: Diagnosis not present

## 2016-04-02 DIAGNOSIS — R030 Elevated blood-pressure reading, without diagnosis of hypertension: Secondary | ICD-10-CM

## 2016-04-02 DIAGNOSIS — L989 Disorder of the skin and subcutaneous tissue, unspecified: Secondary | ICD-10-CM | POA: Diagnosis not present

## 2016-04-02 DIAGNOSIS — B351 Tinea unguium: Secondary | ICD-10-CM | POA: Diagnosis not present

## 2016-04-02 DIAGNOSIS — M810 Age-related osteoporosis without current pathological fracture: Secondary | ICD-10-CM

## 2016-04-02 DIAGNOSIS — R262 Difficulty in walking, not elsewhere classified: Secondary | ICD-10-CM

## 2016-04-02 LAB — CBC WITH DIFFERENTIAL/PLATELET
BASOS PCT: 0 %
Basophils Absolute: 0 cells/uL (ref 0–200)
EOS ABS: 74 {cells}/uL (ref 15–500)
Eosinophils Relative: 1 %
HEMATOCRIT: 42.8 % (ref 35.0–45.0)
HEMOGLOBIN: 14 g/dL (ref 11.7–15.5)
LYMPHS ABS: 888 {cells}/uL (ref 850–3900)
LYMPHS PCT: 12 %
MCH: 30 pg (ref 27.0–33.0)
MCHC: 32.7 g/dL (ref 32.0–36.0)
MCV: 91.8 fL (ref 80.0–100.0)
MONO ABS: 888 {cells}/uL (ref 200–950)
MPV: 9 fL (ref 7.5–12.5)
Monocytes Relative: 12 %
Neutro Abs: 5550 cells/uL (ref 1500–7800)
Neutrophils Relative %: 75 %
Platelets: 240 10*3/uL (ref 140–400)
RBC: 4.66 MIL/uL (ref 3.80–5.10)
RDW: 15.2 % — AB (ref 11.0–15.0)
WBC: 7.4 10*3/uL (ref 3.8–10.8)

## 2016-04-02 NOTE — Progress Notes (Signed)
HPI: Amanda Travis is a 83 y.o. female  who presents to [facility] today, 04/02/16,  for chief complaint of:  Chief Complaint  Patient presents with  . Medicare Wellness    Initially on the schedule for Medicare wellness visit, will defer this for now as patient has some medical issues she would like to address.  Patient lives with her daughter, a different daughter is here with her at the visit today. Daughter she lives with is working more now, having difficulty transferring patient to appointments, taking care of her at home. Curly [place], who is here today, requests assistance finding home health services that may be helpful. Has specific concerns about bathing and ambulation. Patient experienced mechanical fall earlier this year, typically uses a walker to help with balance. Fairly sedentary  Anemia: History of GI bleed. Patient states that she was seen by GI. Months ago, asks about whether I have records for lab results. I do not see anything on file for her.  Concerns about blood pressure: Home monitor has been reading up in the 716R systolic. At this visit, Home BP cuff 04/02/16 160/72. Manual check is 145/75. No chest pain or pressure.  Skin concerns: Skin on the feet is dry. Occasional mild lower extremity swelling. Patient requires assistance with cupping toenails family has some difficulty completing these as to nails are quite yellowed and thickened..  Patient is accompanied by daughter, Curly Sh[place], who assists with history-taking.     Past medical history, surgical history, social history and family history reviewed.  Patient Active Problem List   Diagnosis Date Noted  . Gastric erosions 12/10/2015  . Atrial fibrillation ([place]) 12/08/2015  . GIB (gastrointestinal bleeding) 12/08/2015  . History of cardiac pacemaker 12/08/2015  . Microcytic hypochromic anemia 12/08/2015  . Intractable cyclical vomiting with nausea 06/23/2015  . Gastroesophageal  reflux disease with esophagitis 06/23/2015  . Gait instability 04/19/2015  . Cardiac pacemaker in situ 03/08/2015  . Cough 03/08/2015  . History of glaucoma 03/08/2015  . Palpitations 03/08/2015  . Cardiac risk counseling 03/08/2015  . History of tachycardia-bradycardia syndrome 03/08/2015  . Postmenopausal 03/08/2015  . Osteoporosis 03/08/2015  . Disorder of bone and cartilage 03/08/2015  . Hyperlipidemia 03/08/2015  . History of skin cancer 03/08/2015    Current medication list and allergy/intolerance information reviewed.   Current Outpatient Prescriptions on File Prior to Visit  Medication Sig Dispense Refill  . apixaban (ELIQUIS) 2.5 MG TABS tablet Take 2.5 mg by mouth 2 (two) times daily.    . ARTIFICIAL TEARS 0.1-0.3 % SOLN Apply to eye as needed.     . Calcium Carbonate-Vitamin D 600-400 MG-UNIT chew tablet Chew 2 tablets by mouth daily. 60 tablet 12  . Echinacea 125 MG CAPS Take by mouth. Take 200 mg by mouth daily    . ferrous sulfate 325 (65 FE) MG tablet Take 325 mg by mouth daily with breakfast.    . Flaxseed, Linseed, (FLAXSEED OIL PO) Take by mouth.    . Garlic 6789 MG TABS Take by mouth. Take 1000 mg by mouth daily    . Glucosamine Sulfate 500 MG TABS Take by mouth. Take 200 mg by mouth daily    . latanoprost (XALATAN) 0.005 % ophthalmic solution 1 drop at bedtime.    Marland Kitchen loratadine (CLARITIN) 10 MG tablet Take 10 mg by mouth daily.    Marland Kitchen Lysine 500 MG CAPS Take by mouth. Take 1000 mg by mouth daily    . Melatonin 5 MG TABS  Take by mouth. Take 5 mg by mouth daily    . Multiple Vitamin (MULTIVITAMIN) tablet Take 1 tablet by mouth daily.    Marland Kitchen oxybutynin (DITROPAN) 5 MG tablet Take 1 tablet (5 mg total) by mouth 2 (two) times daily. 180 tablet 3  . Policosanol 10 MG CAPS Take by mouth. Take by mouth 2 times daily    . Potassium 99 MG TABS Take by mouth.    . pyridOXINE (VITAMIN B-6) 100 MG tablet Take 100 mg by mouth daily.    . timolol (TIMOPTIC) 0.25 % ophthalmic  solution 1 drop 2 (two) times daily.    . vitamin C (ASCORBIC ACID) 500 MG tablet Take 500 mg by mouth daily.     No current facility-administered medications on file prior to visit.    Allergies  Allergen Reactions  . Keflex [Cephalexin]   . Tetracyclines & Related   . Valtrex [Valacyclovir Hcl]       Review of Systems:  Constitutional: No recent illness, + fatigue/sedentary   HEENT: No  headache, no vision change  Cardiac: No  chest pain, No  pressure, No palpitations  Respiratory:  No  shortness of breath. No  Cough  Gastrointestinal: No  abdominal pain, no change on bowel habits  Musculoskeletal: No new myalgia/arthralgia  Skin: No  Rash, concern for skin dryness   Hem/Onc: No  easy bruising/bleeding, No  abnormal lumps/bumps  Neurologic: No  weakness, No  Dizziness  Psychiatric: No  concerns with depression, No  concerns with anxiety  Exam:  BP (!) 145/75   Pulse 66   Ht '5\' 1"'  (1.549 m)   Wt 139 lb (63 kg)   BMI 26.26 kg/m   Constitutional: VS see above. General Appearance: alert, well-developed, well-nourished, NAD  Eyes: Normal lids and conjunctive, non-icteric sclera  Ears, Nose, Mouth, Throat: MMM, Normal external inspection ears/nares/mouth/lips/gums.  Neck: No masses, trachea midline.   Respiratory: Normal respiratory effort. no wheeze, no rhonchi, no rales  Cardiovascular: S1/S2 normal, no murmur, no rub/gallop auscultated. RRR.   Musculoskeletal: Gait normal. Symmetric and independent movement of all extremities  Neurological: Normal balance/coordination. No tremor.  Skin: warm, dry, intact. Positive onychomycosis all toenails. Dry, mildly flaky skin but no concern for severe athlete's foot infection, patient denies itching, no drainage/ulceration.   Psychiatric: Normal judgment/insight. Normal mood and affect. Oriented x3.    Recent Results (from the past 2160 hour(s))  CBC with Differential/Platelet     Status: Abnormal   Collection  Time: 04/02/16  2:40 PM  Result Value Ref Range   WBC 7.4 3.8 - 10.8 K/uL   RBC 4.66 3.80 - 5.10 MIL/uL   Hemoglobin 14.0 11.7 - 15.5 g/dL   HCT 42.8 35.0 - 45.0 %   MCV 91.8 80.0 - 100.0 fL   MCH 30.0 27.0 - 33.0 pg   MCHC 32.7 32.0 - 36.0 g/dL   RDW 15.2 (H) 11.0 - 15.0 %   Platelets 240 140 - 400 K/uL   MPV 9.0 7.5 - 12.5 fL   Neutro Abs 5,550 1,500 - 7,800 cells/uL   Lymphs Abs 888 850 - 3,900 cells/uL   Monocytes Absolute 888 200 - 950 cells/uL   Eosinophils Absolute 74 15 - 500 cells/uL   Basophils Absolute 0 0 - 200 cells/uL   Neutrophils Relative % 75 %   Lymphocytes Relative 12 %   Monocytes Relative 12 %   Eosinophils Relative 1 %   Basophils Relative 0 %   Smear Review Criteria  for review not met   COMPLETE METABOLIC PANEL WITH GFR     Status: Abnormal   Collection Time: 04/02/16  2:40 PM  Result Value Ref Range   Sodium 140 135 - 146 mmol/L   Potassium 4.0 3.5 - 5.3 mmol/L   Chloride 104 98 - 110 mmol/L   CO2 25 20 - 31 mmol/L   Glucose, Bld 104 (H) 65 - 99 mg/dL   BUN 21 7 - 25 mg/dL   Creat 0.82 0.60 - 0.88 mg/dL    Comment:   For patients > or = 81 years of age: The upper reference limit for Creatinine is approximately 13% higher for people identified as African-American.      Total Bilirubin 0.6 0.2 - 1.2 mg/dL   Alkaline Phosphatase 52 33 - 130 U/L   AST 12 10 - 35 U/L   ALT 9 6 - 29 U/L   Total Protein 6.9 6.1 - 8.1 g/dL   Albumin 4.0 3.6 - 5.1 g/dL   Calcium 8.9 8.6 - 10.4 mg/dL   GFR, Est African American 75 >=60 mL/min   GFR, Est Non African American 65 >=60 mL/min  Iron and TIBC     Status: None   Collection Time: 04/02/16  2:40 PM  Result Value Ref Range   Iron 152 45 - 160 ug/dL   UIBC 204 125 - 400 ug/dL   TIBC 356 250 - 450 ug/dL   %SAT 43 11 - 50 %    No results found.  No flowsheet data found.  Depression screen [facility] 2/9 04/02/2016 04/05/2015  Decreased Interest 0 0  Down, Depressed, Hopeless 0 0  PHQ - 2 Score 0 0  Altered  sleeping 0 -  Tired, decreased energy 0 -  Change in appetite 0 -  Feeling bad or failure about yourself  0 -  Trouble concentrating 0 -  Moving slowly or fidgety/restless 0 -  Suicidal thoughts 0 -  PHQ-9 Score 0 -    Immunization History  Administered Date(s) Administered  . Influenza-Unspecified 12/08/2014, 12/12/2015  . Pneumococcal Conjugate-13 10/19/2013  . Pneumococcal Polysaccharide-23 12/08/2014  . Tdap 04/05/2015  . Zoster 10/21/2013     ASSESSMENT/PLAN:   Ambulatory dysfunction - Fall precautions reviewed. Home health for physical therapy ordered - Plan: Ambulatory referral to Home Health  Anemia, unspecified type - Plan: CBC with Differential/Platelet, COMPLETE METABOLIC PANEL WITH GFR, Iron and TIBC  Elevated blood pressure reading - Advised home blood pressure cuff reads high  Onychomycosis - Option for topical versus oral medications. Podiatry referral discussed for toe nail care. Family will think about this  Age-related osteoporosis without current pathological fracture - 05/07/16 Prolia Injection due per patient family  Skin problem - Pulses palpable. Advised keep clean and mild moisturizer. Can try OTC Lamisil but I don't have strong suspicion for fungal infection. Will think about podiatry  Patient Instructions  You'll get a call about  Home services  Home physical therapy  Lab results when available    Follow-up plan: Return in about 3 months (around 06/30/2016) for CHECK-UP BLOOD PRESSURE.  Visit summary with medication list and pertinent instructions was printed for patient to review, alert [place] if any changes needed. All questions at time of visit were answered - patient instructed to contact office with any additional concerns. ER/RTC precautions were reviewed with the patient and understanding verbalized.   Note: Total time spent 25 minutes, greater than 50% of the visit was spent face-to-face counseling and coordinating care  for the following:  The primary encounter diagnosis was Ambulatory dysfunction. Diagnoses of Influenza vaccine needed, Anemia, unspecified type, and Elevated blood pressure reading were also pertinent to this visit..   Calcium 1730 Vit D 1500 units

## 2016-04-02 NOTE — Patient Instructions (Signed)
You'll get a call about  Home services  Home physical therapy  Lab results when available

## 2016-04-03 ENCOUNTER — Encounter: Payer: Medicare Other | Admitting: Osteopathic Medicine

## 2016-04-03 DIAGNOSIS — M9904 Segmental and somatic dysfunction of sacral region: Secondary | ICD-10-CM | POA: Diagnosis not present

## 2016-04-03 DIAGNOSIS — M791 Myalgia: Secondary | ICD-10-CM | POA: Diagnosis not present

## 2016-04-03 DIAGNOSIS — M9903 Segmental and somatic dysfunction of lumbar region: Secondary | ICD-10-CM | POA: Diagnosis not present

## 2016-04-03 DIAGNOSIS — M9901 Segmental and somatic dysfunction of cervical region: Secondary | ICD-10-CM | POA: Diagnosis not present

## 2016-04-03 DIAGNOSIS — M9902 Segmental and somatic dysfunction of thoracic region: Secondary | ICD-10-CM | POA: Diagnosis not present

## 2016-04-03 DIAGNOSIS — R030 Elevated blood-pressure reading, without diagnosis of hypertension: Secondary | ICD-10-CM | POA: Insufficient documentation

## 2016-04-03 DIAGNOSIS — M545 Low back pain: Secondary | ICD-10-CM | POA: Diagnosis not present

## 2016-04-03 LAB — COMPLETE METABOLIC PANEL WITH GFR
ALBUMIN: 4 g/dL (ref 3.6–5.1)
ALK PHOS: 52 U/L (ref 33–130)
ALT: 9 U/L (ref 6–29)
AST: 12 U/L (ref 10–35)
BILIRUBIN TOTAL: 0.6 mg/dL (ref 0.2–1.2)
BUN: 21 mg/dL (ref 7–25)
CALCIUM: 8.9 mg/dL (ref 8.6–10.4)
CO2: 25 mmol/L (ref 20–31)
CREATININE: 0.82 mg/dL (ref 0.60–0.88)
Chloride: 104 mmol/L (ref 98–110)
GFR, EST AFRICAN AMERICAN: 75 mL/min (ref >=60)
GFR, Est Non African American: 65 mL/min (ref >=60)
Glucose, Bld: 104 mg/dL — ABNORMAL HIGH (ref 65–99)
Potassium: 4 mmol/L (ref 3.5–5.3)
Sodium: 140 mmol/L (ref 135–146)
TOTAL PROTEIN: 6.9 g/dL (ref 6.1–8.1)

## 2016-04-03 LAB — IRON AND TIBC
%SAT: 43 % (ref 11–50)
IRON: 152 ug/dL (ref 45–160)
TIBC: 356 ug/dL (ref 250–450)
UIBC: 204 ug/dL (ref 125–400)

## 2016-04-04 DIAGNOSIS — Z950 Presence of cardiac pacemaker: Secondary | ICD-10-CM | POA: Diagnosis not present

## 2016-04-04 DIAGNOSIS — I480 Paroxysmal atrial fibrillation: Secondary | ICD-10-CM | POA: Diagnosis not present

## 2016-04-07 DIAGNOSIS — R262 Difficulty in walking, not elsewhere classified: Secondary | ICD-10-CM | POA: Diagnosis not present

## 2016-04-07 DIAGNOSIS — M810 Age-related osteoporosis without current pathological fracture: Secondary | ICD-10-CM | POA: Diagnosis not present

## 2016-04-07 DIAGNOSIS — K219 Gastro-esophageal reflux disease without esophagitis: Secondary | ICD-10-CM | POA: Diagnosis not present

## 2016-04-07 DIAGNOSIS — Z7901 Long term (current) use of anticoagulants: Secondary | ICD-10-CM | POA: Diagnosis not present

## 2016-04-07 DIAGNOSIS — M859 Disorder of bone density and structure, unspecified: Secondary | ICD-10-CM | POA: Diagnosis not present

## 2016-04-07 DIAGNOSIS — E785 Hyperlipidemia, unspecified: Secondary | ICD-10-CM | POA: Diagnosis not present

## 2016-04-07 DIAGNOSIS — D649 Anemia, unspecified: Secondary | ICD-10-CM | POA: Diagnosis not present

## 2016-04-07 DIAGNOSIS — Z950 Presence of cardiac pacemaker: Secondary | ICD-10-CM | POA: Diagnosis not present

## 2016-04-07 DIAGNOSIS — Z85828 Personal history of other malignant neoplasm of skin: Secondary | ICD-10-CM | POA: Diagnosis not present

## 2016-04-07 DIAGNOSIS — I4891 Unspecified atrial fibrillation: Secondary | ICD-10-CM | POA: Diagnosis not present

## 2016-04-08 DIAGNOSIS — M9901 Segmental and somatic dysfunction of cervical region: Secondary | ICD-10-CM | POA: Diagnosis not present

## 2016-04-08 DIAGNOSIS — M9902 Segmental and somatic dysfunction of thoracic region: Secondary | ICD-10-CM | POA: Diagnosis not present

## 2016-04-08 DIAGNOSIS — M791 Myalgia: Secondary | ICD-10-CM | POA: Diagnosis not present

## 2016-04-08 DIAGNOSIS — M9903 Segmental and somatic dysfunction of lumbar region: Secondary | ICD-10-CM | POA: Diagnosis not present

## 2016-04-08 DIAGNOSIS — M9904 Segmental and somatic dysfunction of sacral region: Secondary | ICD-10-CM | POA: Diagnosis not present

## 2016-04-08 DIAGNOSIS — M545 Low back pain: Secondary | ICD-10-CM | POA: Diagnosis not present

## 2016-04-10 DIAGNOSIS — M9901 Segmental and somatic dysfunction of cervical region: Secondary | ICD-10-CM | POA: Diagnosis not present

## 2016-04-10 DIAGNOSIS — M791 Myalgia: Secondary | ICD-10-CM | POA: Diagnosis not present

## 2016-04-10 DIAGNOSIS — M545 Low back pain: Secondary | ICD-10-CM | POA: Diagnosis not present

## 2016-04-10 DIAGNOSIS — M9903 Segmental and somatic dysfunction of lumbar region: Secondary | ICD-10-CM | POA: Diagnosis not present

## 2016-04-10 DIAGNOSIS — M9904 Segmental and somatic dysfunction of sacral region: Secondary | ICD-10-CM | POA: Diagnosis not present

## 2016-04-10 DIAGNOSIS — M9902 Segmental and somatic dysfunction of thoracic region: Secondary | ICD-10-CM | POA: Diagnosis not present

## 2016-04-11 DIAGNOSIS — E785 Hyperlipidemia, unspecified: Secondary | ICD-10-CM | POA: Diagnosis not present

## 2016-04-11 DIAGNOSIS — I4891 Unspecified atrial fibrillation: Secondary | ICD-10-CM | POA: Diagnosis not present

## 2016-04-11 DIAGNOSIS — K219 Gastro-esophageal reflux disease without esophagitis: Secondary | ICD-10-CM | POA: Diagnosis not present

## 2016-04-11 DIAGNOSIS — R262 Difficulty in walking, not elsewhere classified: Secondary | ICD-10-CM | POA: Diagnosis not present

## 2016-04-11 DIAGNOSIS — M859 Disorder of bone density and structure, unspecified: Secondary | ICD-10-CM | POA: Diagnosis not present

## 2016-04-11 DIAGNOSIS — M810 Age-related osteoporosis without current pathological fracture: Secondary | ICD-10-CM | POA: Diagnosis not present

## 2016-04-12 DIAGNOSIS — I4891 Unspecified atrial fibrillation: Secondary | ICD-10-CM | POA: Diagnosis not present

## 2016-04-12 DIAGNOSIS — K219 Gastro-esophageal reflux disease without esophagitis: Secondary | ICD-10-CM | POA: Diagnosis not present

## 2016-04-12 DIAGNOSIS — E785 Hyperlipidemia, unspecified: Secondary | ICD-10-CM | POA: Diagnosis not present

## 2016-04-12 DIAGNOSIS — R262 Difficulty in walking, not elsewhere classified: Secondary | ICD-10-CM | POA: Diagnosis not present

## 2016-04-12 DIAGNOSIS — M859 Disorder of bone density and structure, unspecified: Secondary | ICD-10-CM | POA: Diagnosis not present

## 2016-04-12 DIAGNOSIS — M810 Age-related osteoporosis without current pathological fracture: Secondary | ICD-10-CM | POA: Diagnosis not present

## 2016-04-15 DIAGNOSIS — M9903 Segmental and somatic dysfunction of lumbar region: Secondary | ICD-10-CM | POA: Diagnosis not present

## 2016-04-15 DIAGNOSIS — M9902 Segmental and somatic dysfunction of thoracic region: Secondary | ICD-10-CM | POA: Diagnosis not present

## 2016-04-15 DIAGNOSIS — M9904 Segmental and somatic dysfunction of sacral region: Secondary | ICD-10-CM | POA: Diagnosis not present

## 2016-04-15 DIAGNOSIS — M545 Low back pain: Secondary | ICD-10-CM | POA: Diagnosis not present

## 2016-04-15 DIAGNOSIS — M791 Myalgia: Secondary | ICD-10-CM | POA: Diagnosis not present

## 2016-04-15 DIAGNOSIS — M9901 Segmental and somatic dysfunction of cervical region: Secondary | ICD-10-CM | POA: Diagnosis not present

## 2016-04-16 DIAGNOSIS — M810 Age-related osteoporosis without current pathological fracture: Secondary | ICD-10-CM | POA: Diagnosis not present

## 2016-04-16 DIAGNOSIS — R262 Difficulty in walking, not elsewhere classified: Secondary | ICD-10-CM | POA: Diagnosis not present

## 2016-04-16 DIAGNOSIS — K219 Gastro-esophageal reflux disease without esophagitis: Secondary | ICD-10-CM | POA: Diagnosis not present

## 2016-04-16 DIAGNOSIS — M859 Disorder of bone density and structure, unspecified: Secondary | ICD-10-CM | POA: Diagnosis not present

## 2016-04-16 DIAGNOSIS — E785 Hyperlipidemia, unspecified: Secondary | ICD-10-CM | POA: Diagnosis not present

## 2016-04-16 DIAGNOSIS — I4891 Unspecified atrial fibrillation: Secondary | ICD-10-CM | POA: Diagnosis not present

## 2016-04-17 DIAGNOSIS — E785 Hyperlipidemia, unspecified: Secondary | ICD-10-CM | POA: Diagnosis not present

## 2016-04-17 DIAGNOSIS — M810 Age-related osteoporosis without current pathological fracture: Secondary | ICD-10-CM | POA: Diagnosis not present

## 2016-04-17 DIAGNOSIS — K219 Gastro-esophageal reflux disease without esophagitis: Secondary | ICD-10-CM | POA: Diagnosis not present

## 2016-04-17 DIAGNOSIS — M859 Disorder of bone density and structure, unspecified: Secondary | ICD-10-CM | POA: Diagnosis not present

## 2016-04-17 DIAGNOSIS — I4891 Unspecified atrial fibrillation: Secondary | ICD-10-CM | POA: Diagnosis not present

## 2016-04-17 DIAGNOSIS — R262 Difficulty in walking, not elsewhere classified: Secondary | ICD-10-CM | POA: Diagnosis not present

## 2016-04-19 ENCOUNTER — Telehealth: Payer: Self-pay

## 2016-04-19 DIAGNOSIS — M810 Age-related osteoporosis without current pathological fracture: Secondary | ICD-10-CM | POA: Diagnosis not present

## 2016-04-19 DIAGNOSIS — R262 Difficulty in walking, not elsewhere classified: Secondary | ICD-10-CM | POA: Diagnosis not present

## 2016-04-19 DIAGNOSIS — M859 Disorder of bone density and structure, unspecified: Secondary | ICD-10-CM | POA: Diagnosis not present

## 2016-04-19 DIAGNOSIS — E785 Hyperlipidemia, unspecified: Secondary | ICD-10-CM | POA: Diagnosis not present

## 2016-04-19 DIAGNOSIS — I4891 Unspecified atrial fibrillation: Secondary | ICD-10-CM | POA: Diagnosis not present

## 2016-04-19 DIAGNOSIS — K219 Gastro-esophageal reflux disease without esophagitis: Secondary | ICD-10-CM | POA: Diagnosis not present

## 2016-04-19 NOTE — Telephone Encounter (Signed)
Patient called wants to know if she needs labs done before she get her bone density injection?  ( that's exactly how the patient called it). Please advise.Rhonda Cunningham,CMA

## 2016-04-22 DIAGNOSIS — M9904 Segmental and somatic dysfunction of sacral region: Secondary | ICD-10-CM | POA: Diagnosis not present

## 2016-04-22 DIAGNOSIS — M9903 Segmental and somatic dysfunction of lumbar region: Secondary | ICD-10-CM | POA: Diagnosis not present

## 2016-04-22 DIAGNOSIS — M791 Myalgia: Secondary | ICD-10-CM | POA: Diagnosis not present

## 2016-04-22 DIAGNOSIS — M545 Low back pain: Secondary | ICD-10-CM | POA: Diagnosis not present

## 2016-04-22 DIAGNOSIS — M9902 Segmental and somatic dysfunction of thoracic region: Secondary | ICD-10-CM | POA: Diagnosis not present

## 2016-04-22 DIAGNOSIS — M9901 Segmental and somatic dysfunction of cervical region: Secondary | ICD-10-CM | POA: Diagnosis not present

## 2016-04-22 NOTE — Telephone Encounter (Signed)
Her recent lab work from months ago looked fine, I wouldn't worry about checking it began if she gets the injection as scheduled

## 2016-04-23 DIAGNOSIS — I4891 Unspecified atrial fibrillation: Secondary | ICD-10-CM | POA: Diagnosis not present

## 2016-04-23 DIAGNOSIS — R262 Difficulty in walking, not elsewhere classified: Secondary | ICD-10-CM | POA: Diagnosis not present

## 2016-04-23 DIAGNOSIS — M859 Disorder of bone density and structure, unspecified: Secondary | ICD-10-CM | POA: Diagnosis not present

## 2016-04-23 DIAGNOSIS — K219 Gastro-esophageal reflux disease without esophagitis: Secondary | ICD-10-CM | POA: Diagnosis not present

## 2016-04-23 DIAGNOSIS — Z85828 Personal history of other malignant neoplasm of skin: Secondary | ICD-10-CM | POA: Diagnosis not present

## 2016-04-23 DIAGNOSIS — E785 Hyperlipidemia, unspecified: Secondary | ICD-10-CM | POA: Diagnosis not present

## 2016-04-23 DIAGNOSIS — M810 Age-related osteoporosis without current pathological fracture: Secondary | ICD-10-CM | POA: Diagnosis not present

## 2016-04-23 DIAGNOSIS — Z08 Encounter for follow-up examination after completed treatment for malignant neoplasm: Secondary | ICD-10-CM | POA: Diagnosis not present

## 2016-04-23 DIAGNOSIS — C44311 Basal cell carcinoma of skin of nose: Secondary | ICD-10-CM | POA: Diagnosis not present

## 2016-04-23 NOTE — Telephone Encounter (Signed)
Patient has been informed. Laiken Nohr,CMA  

## 2016-04-26 DIAGNOSIS — I4891 Unspecified atrial fibrillation: Secondary | ICD-10-CM | POA: Diagnosis not present

## 2016-04-26 DIAGNOSIS — K219 Gastro-esophageal reflux disease without esophagitis: Secondary | ICD-10-CM | POA: Diagnosis not present

## 2016-04-26 DIAGNOSIS — M859 Disorder of bone density and structure, unspecified: Secondary | ICD-10-CM | POA: Diagnosis not present

## 2016-04-26 DIAGNOSIS — R262 Difficulty in walking, not elsewhere classified: Secondary | ICD-10-CM | POA: Diagnosis not present

## 2016-04-26 DIAGNOSIS — M810 Age-related osteoporosis without current pathological fracture: Secondary | ICD-10-CM | POA: Diagnosis not present

## 2016-04-26 DIAGNOSIS — E785 Hyperlipidemia, unspecified: Secondary | ICD-10-CM | POA: Diagnosis not present

## 2016-04-30 DIAGNOSIS — I4891 Unspecified atrial fibrillation: Secondary | ICD-10-CM | POA: Diagnosis not present

## 2016-04-30 DIAGNOSIS — M810 Age-related osteoporosis without current pathological fracture: Secondary | ICD-10-CM | POA: Diagnosis not present

## 2016-04-30 DIAGNOSIS — M859 Disorder of bone density and structure, unspecified: Secondary | ICD-10-CM | POA: Diagnosis not present

## 2016-04-30 DIAGNOSIS — K219 Gastro-esophageal reflux disease without esophagitis: Secondary | ICD-10-CM | POA: Diagnosis not present

## 2016-04-30 DIAGNOSIS — R262 Difficulty in walking, not elsewhere classified: Secondary | ICD-10-CM | POA: Diagnosis not present

## 2016-04-30 DIAGNOSIS — E785 Hyperlipidemia, unspecified: Secondary | ICD-10-CM | POA: Diagnosis not present

## 2016-05-01 DIAGNOSIS — M545 Low back pain: Secondary | ICD-10-CM | POA: Diagnosis not present

## 2016-05-01 DIAGNOSIS — M9904 Segmental and somatic dysfunction of sacral region: Secondary | ICD-10-CM | POA: Diagnosis not present

## 2016-05-01 DIAGNOSIS — M9902 Segmental and somatic dysfunction of thoracic region: Secondary | ICD-10-CM | POA: Diagnosis not present

## 2016-05-01 DIAGNOSIS — M9901 Segmental and somatic dysfunction of cervical region: Secondary | ICD-10-CM | POA: Diagnosis not present

## 2016-05-01 DIAGNOSIS — M791 Myalgia: Secondary | ICD-10-CM | POA: Diagnosis not present

## 2016-05-01 DIAGNOSIS — M9903 Segmental and somatic dysfunction of lumbar region: Secondary | ICD-10-CM | POA: Diagnosis not present

## 2016-05-02 DIAGNOSIS — K219 Gastro-esophageal reflux disease without esophagitis: Secondary | ICD-10-CM | POA: Diagnosis not present

## 2016-05-02 DIAGNOSIS — M859 Disorder of bone density and structure, unspecified: Secondary | ICD-10-CM | POA: Diagnosis not present

## 2016-05-02 DIAGNOSIS — E785 Hyperlipidemia, unspecified: Secondary | ICD-10-CM | POA: Diagnosis not present

## 2016-05-02 DIAGNOSIS — R262 Difficulty in walking, not elsewhere classified: Secondary | ICD-10-CM | POA: Diagnosis not present

## 2016-05-02 DIAGNOSIS — I4891 Unspecified atrial fibrillation: Secondary | ICD-10-CM | POA: Diagnosis not present

## 2016-05-02 DIAGNOSIS — M810 Age-related osteoporosis without current pathological fracture: Secondary | ICD-10-CM | POA: Diagnosis not present

## 2016-05-07 ENCOUNTER — Ambulatory Visit: Payer: Medicare Other

## 2016-05-07 ENCOUNTER — Other Ambulatory Visit: Payer: Self-pay | Admitting: Osteopathic Medicine

## 2016-05-07 DIAGNOSIS — M810 Age-related osteoporosis without current pathological fracture: Secondary | ICD-10-CM | POA: Diagnosis not present

## 2016-05-08 DIAGNOSIS — E785 Hyperlipidemia, unspecified: Secondary | ICD-10-CM | POA: Diagnosis not present

## 2016-05-08 DIAGNOSIS — M810 Age-related osteoporosis without current pathological fracture: Secondary | ICD-10-CM | POA: Diagnosis not present

## 2016-05-08 DIAGNOSIS — I4891 Unspecified atrial fibrillation: Secondary | ICD-10-CM | POA: Diagnosis not present

## 2016-05-08 DIAGNOSIS — R262 Difficulty in walking, not elsewhere classified: Secondary | ICD-10-CM | POA: Diagnosis not present

## 2016-05-08 DIAGNOSIS — M859 Disorder of bone density and structure, unspecified: Secondary | ICD-10-CM | POA: Diagnosis not present

## 2016-05-08 DIAGNOSIS — K219 Gastro-esophageal reflux disease without esophagitis: Secondary | ICD-10-CM | POA: Diagnosis not present

## 2016-05-08 LAB — COMPLETE METABOLIC PANEL WITH GFR
ALT: 8 U/L (ref 6–29)
AST: 14 U/L (ref 10–35)
Albumin: 3.9 g/dL (ref 3.6–5.1)
Alkaline Phosphatase: 57 U/L (ref 33–130)
BUN: 26 mg/dL — AB (ref 7–25)
CHLORIDE: 100 mmol/L (ref 98–110)
CO2: 27 mmol/L (ref 20–31)
Calcium: 9.3 mg/dL (ref 8.6–10.4)
Creat: 1.03 mg/dL — ABNORMAL HIGH (ref 0.60–0.88)
GFR, Est African American: 57 mL/min — ABNORMAL LOW (ref >=60)
GFR, Est Non African American: 50 mL/min — ABNORMAL LOW (ref >=60)
GLUCOSE: 106 mg/dL — AB (ref 65–99)
POTASSIUM: 4 mmol/L (ref 3.5–5.3)
SODIUM: 138 mmol/L (ref 135–146)
Total Bilirubin: 0.9 mg/dL (ref 0.2–1.2)
Total Protein: 6.8 g/dL (ref 6.1–8.1)

## 2016-05-09 DIAGNOSIS — M9904 Segmental and somatic dysfunction of sacral region: Secondary | ICD-10-CM | POA: Diagnosis not present

## 2016-05-09 DIAGNOSIS — M9902 Segmental and somatic dysfunction of thoracic region: Secondary | ICD-10-CM | POA: Diagnosis not present

## 2016-05-09 DIAGNOSIS — M791 Myalgia: Secondary | ICD-10-CM | POA: Diagnosis not present

## 2016-05-09 DIAGNOSIS — M9901 Segmental and somatic dysfunction of cervical region: Secondary | ICD-10-CM | POA: Diagnosis not present

## 2016-05-09 DIAGNOSIS — M545 Low back pain: Secondary | ICD-10-CM | POA: Diagnosis not present

## 2016-05-09 DIAGNOSIS — M9903 Segmental and somatic dysfunction of lumbar region: Secondary | ICD-10-CM | POA: Diagnosis not present

## 2016-05-10 DIAGNOSIS — E785 Hyperlipidemia, unspecified: Secondary | ICD-10-CM | POA: Diagnosis not present

## 2016-05-10 DIAGNOSIS — R262 Difficulty in walking, not elsewhere classified: Secondary | ICD-10-CM | POA: Diagnosis not present

## 2016-05-10 DIAGNOSIS — M810 Age-related osteoporosis without current pathological fracture: Secondary | ICD-10-CM | POA: Diagnosis not present

## 2016-05-10 DIAGNOSIS — K219 Gastro-esophageal reflux disease without esophagitis: Secondary | ICD-10-CM | POA: Diagnosis not present

## 2016-05-10 DIAGNOSIS — M859 Disorder of bone density and structure, unspecified: Secondary | ICD-10-CM | POA: Diagnosis not present

## 2016-05-10 DIAGNOSIS — I4891 Unspecified atrial fibrillation: Secondary | ICD-10-CM | POA: Diagnosis not present

## 2016-05-24 ENCOUNTER — Ambulatory Visit (INDEPENDENT_AMBULATORY_CARE_PROVIDER_SITE_OTHER): Payer: Medicare Other | Admitting: Sports Medicine

## 2016-05-24 VITALS — BP 154/75 | HR 65 | Ht 61.0 in | Wt 137.9 lb

## 2016-05-24 DIAGNOSIS — M810 Age-related osteoporosis without current pathological fracture: Secondary | ICD-10-CM | POA: Diagnosis not present

## 2016-05-24 MED ORDER — DENOSUMAB 60 MG/ML ~~LOC~~ SOLN
60.0000 mg | Freq: Once | SUBCUTANEOUS | Status: AC
  Administered 2016-05-24: 60 mg via SUBCUTANEOUS

## 2016-05-24 NOTE — Progress Notes (Signed)
   Subjective:    Patient ID: Amanda Travis, female    DOB: [birth year], 83 y.o.   MRN: [id number]  HPI Pt is here for Prolia injection. Pt reports taking calcium and vitamin D daily. Pt's calcium levels and kidney function was within normal limits.    Review of Systems     Objective:   Physical Exam        Assessment & Plan:  Pt tolerated injection well and without complications. Pt advised to schedule next injection in 6 months.

## 2016-05-28 ENCOUNTER — Ambulatory Visit (INDEPENDENT_AMBULATORY_CARE_PROVIDER_SITE_OTHER): Payer: Medicare Other | Admitting: Osteopathic Medicine

## 2016-05-28 VITALS — BP 165/84 | HR 81 | Temp 98.0°F | Resp 16 | Wt 137.0 lb

## 2016-05-28 DIAGNOSIS — Z0000 Encounter for general adult medical examination without abnormal findings: Secondary | ICD-10-CM

## 2016-05-28 DIAGNOSIS — B351 Tinea unguium: Secondary | ICD-10-CM

## 2016-05-28 DIAGNOSIS — Z7189 Other specified counseling: Secondary | ICD-10-CM

## 2016-05-28 MED ORDER — ZOSTER VAC RECOMB ADJUVANTED 50 MCG/0.5ML IM SUSR: 0.5000 mL | Freq: Once | INTRAMUSCULAR | 1 refills | Status: AC

## 2016-05-28 NOTE — Progress Notes (Signed)
HPI: Amanda Travis is a 83 y.o. female  who presents to [facility] today, 05/28/16,  for Medicare Annual Wellness Exam   Patient presents for annual physical/Medicare wellness exam. Few other complaints today: Would will like me to check see if toenail fungus has improved.  See below for review of preventive care.    Past medical, surgical, social and family history reviewed:  Patient Active Problem List   Diagnosis Date Noted  . Elevated blood pressure reading 04/03/2016  . Gastric erosions 12/10/2015  . Atrial fibrillation ([place]) 12/08/2015  . GIB (gastrointestinal bleeding) 12/08/2015  . History of cardiac pacemaker 12/08/2015  . Microcytic hypochromic anemia 12/08/2015  . Intractable cyclical vomiting with nausea 06/23/2015  . Gastroesophageal reflux disease with esophagitis 06/23/2015  . Gait instability 04/19/2015  . Cardiac pacemaker in situ 03/08/2015  . Cough 03/08/2015  . History of glaucoma 03/08/2015  . Palpitations 03/08/2015  . Cardiac risk counseling 03/08/2015  . History of tachycardia-bradycardia syndrome 03/08/2015  . Postmenopausal 03/08/2015  . Osteoporosis 03/08/2015  . Disorder of bone and cartilage 03/08/2015  . Hyperlipidemia 03/08/2015  . History of skin cancer 03/08/2015    Past Surgical History:  Procedure Laterality Date  . ABDOMINAL HYSTERECTOMY  1979  . APPENDECTOMY    . BLOOD CLOT  2006  . CHOLECYSTECTOMY    . COLON SURGERY  1998  . EYE SURGERY     CATARACT  . JOINT REPLACEMENT Bilateral    KNEE  . MOHS SURGERY  2015  . OVARIAN CANCER  1997  . TOOTH EXTRACTION      Social History   Social History  . Marital status: Widowed    Spouse name: N/A  . Number of children: N/A  . Years of education: N/A   Occupational History  . Not on file.   Social History Main Topics  . Smoking status: Never Smoker  . Smokeless tobacco: Never Used  . Alcohol use No  . Drug use: No  . Sexual activity: No    Other Topics Concern  . Not on file   Social History Narrative  . No narrative on file    Family History  Problem Relation Age of Onset  . Stroke Mother   . Heart disease Sister   . Dementia Brother   . Heart disease Brother      Current medication list and allergy/intolerance information reviewed:    Outpatient Encounter Prescriptions as of 05/28/2016  Medication Sig  . apixaban (ELIQUIS) 2.5 MG TABS tablet Take 2.5 mg by mouth 2 (two) times daily.  . ARTIFICIAL TEARS 0.1-0.3 % SOLN Apply to eye as needed.   . Calcium Carbonate-Vitamin D 600-400 MG-UNIT chew tablet Chew 2 tablets by mouth daily.  . Echinacea 125 MG CAPS Take by mouth. Take 200 mg by mouth daily  . ferrous sulfate 325 (65 FE) MG tablet Take 325 mg by mouth daily with breakfast.  . Flaxseed, Linseed, (FLAXSEED OIL PO) Take by mouth.  . Garlic 1610 MG TABS Take by mouth. Take 1000 mg by mouth daily  . Glucosamine Sulfate 500 MG TABS Take by mouth. Take 200 mg by mouth daily  . latanoprost (XALATAN) 0.005 % ophthalmic solution 1 drop at bedtime.  Marland Kitchen loratadine (CLARITIN) 10 MG tablet Take 10 mg by mouth daily.  Marland Kitchen Lysine 500 MG CAPS Take by mouth. Take 1000 mg by mouth daily  . Melatonin 5 MG TABS Take by mouth. Take 5 mg by mouth daily  .  Multiple Vitamin (MULTIVITAMIN) tablet Take 1 tablet by mouth daily.  Marland Kitchen oxybutynin (DITROPAN) 5 MG tablet Take 1 tablet (5 mg total) by mouth 2 (two) times daily.  . pantoprazole (PROTONIX) 40 MG tablet Take 40 mg by mouth 2 (two) times daily.  . Policosanol 10 MG CAPS Take by mouth. Take by mouth 2 times daily  . Potassium 99 MG TABS Take by mouth.  . pyridOXINE (VITAMIN B-6) 100 MG tablet Take 100 mg by mouth daily.  . timolol (TIMOPTIC) 0.25 % ophthalmic solution 1 drop 2 (two) times daily.  . vitamin C (ASCORBIC ACID) 500 MG tablet Take 500 mg by mouth daily.   No facility-administered encounter medications on file as of 05/28/2016.     Allergies  Allergen Reactions   . Keflex [Cephalexin]   . Tetracyclines & Related   . Valtrex [Valacyclovir Hcl]        Review of Systems: Review of Systems - General ROS: negative   Medicare Wellness Questionnaire  Are there smokers in your home (other than you)? no  Depression Screen (Note: if answer to either of the following is "Yes", a more complete depression screening is indicated)   Q1: Over the past two weeks, have you felt down, depressed or hopeless? no  Q2: Over the past two weeks, have you felt little interest or pleasure in doing things? no  Have you lost interest or pleasure in daily life? no  Do you often feel hopeless? no  Do you cry easily over simple problems? no  Activities of Daily Living In your present state of health, do you have any difficulty performing the following activities?:  Driving? no Managing money?  no Feeding yourself? no Getting from bed to chair? no Climbing a flight of stairs? no Preparing food and eating?: no Bathing or showering? no Getting dressed: no Getting to the toilet? no Using the toilet: no Moving around from place to place: no In the past year have you fallen or had a near fall?: yes  Hearing Difficulties:  Do you often ask people to speak up or repeat themselves? no Do you experience ringing or noises in your ears? no  Do you have difficulty understanding soft or whispered voices? Yes - wears hearing aids  Memory Difficulties:  Do you feel that you have a problem with memory? no  Do you often misplace items? no  Do you feel safe at home?  yes  Sexual Health:   Are you sexually active?  No  Do you have more than one partner?  No  Advanced Directives:   Advanced directives discussed: has an advanced directive - a copy HAS NOT been provided.  Additional information provided: yes  Risk Factors  Current exercise habits: none  Dietary issues discussed:no concerns  Cardiac risk factors: known cardiac disease, hypertension,  hypercholesterolemia/hyperlipidemia, generalized debilitation  Other Doctors: Immunologist Cardio: Dr Mauricio Po   Exam:  BP (!) 165/84 (BP Location: Left Arm, Patient Position: Sitting, Cuff Size: Normal)   Pulse 81   Temp 98 F (36.7 C) (Oral)   Resp 16   Wt 137 lb (62.1 kg)   SpO2 96%   BMI 25.89 kg/m   158/74 on recheck  Vision by Snellen chart: right eye:see nurse notes, left eye:see nurse notes  Constitutional: VS see above. General Appearance: alert, well-developed, well-nourished, NAD  Ears, Nose, Mouth, Throat: MMM  Neck: No masses, trachea midline.   Respiratory: Normal respiratory effort. no wheeze, no rhonchi, no rales  Cardiovascular:No lower  extremity edema. S1S2 normal, RRR  Musculoskeletal: Gait normal. No clubbing/cyanosis of digits.   Neurological: Normal balance/coordination. No tremor. Recalls 3 objects and able to read face of watch with correct time.   Skin: warm, dry, intact. No rash/ulcer. Onychomycosis right foot fourth and fifth digits, mild  Psychiatric: Normal judgment/insight. Normal mood and affect. Oriented x3.     ASSESSMENT/PLAN: Patient is here today for Encounter for Medicare annual wellness exam.   Medicare annual wellness visit, subsequent  Onychomycosis - Patient is treating with something at home and thinks looking better overall, okay to continue, consider podiatry referral/by mouth meds if no better  Advance directive discussed with patient - Updated DO NOT RESUSCITATE form per patient request, sent home with MOST form, let me know if any questions or if we'll like me to fill this out  CANCER SCREENING  Lung - does not need  Colon - does not need  Prostate - does not need  Breast - does not need  Cervical - does not need OTHER DISEASE SCREENING  Lipid - does not need  DM2 - does not need  AAA - female 18-75yo ever smoked - does not need  Osteoporosis - women 81yo+, men 81yo+ - does not need - last  02/2015 INFECTIOUS DISEASE SCREENING  HIV - does not need  GC/CT - does not need  HepC -does not need  TB - does not need ADULT VACCINATION  Influenza - annual vaccine recommended  Td - booster every 10 years - does not need  Zoster - option at 3, yes at 60+ - needs  PCV13 - does not need  PPSV23 - does not need Immunization History  Administered Date(s) Administered  . Influenza-Unspecified 12/08/2014, 12/12/2015  . Pneumococcal Conjugate-13 10/19/2013  . Pneumococcal Polysaccharide-23 12/08/2014  . Tdap 04/05/2015  . Zoster 10/21/2013   OTHER  Fall - exercise and Vit D age 36+ - needs  Advanced Directives -  Discussed as above   During the course of the visit the patient was educated and counseled about appropriate screening and preventive services as noted above.   Patient Instructions (the written plan) was given to the patient.  Medicare Attestation I have personally reviewed: The patient's medical and social history Their use of alcohol, tobacco or illicit drugs Their current medications and supplements The patient's functional ability including ADLs,fall risks, home safety risks, cognitive, and hearing and visual impairment Diet and physical activities Evidence for depression or mood disorders  The patient's weight, height, BMI, and visual acuity have been recorded in the chart.  I have made referrals, counseling, and provided education to the patient based on review of the above and I have provided the patient with a written personalized care plan for preventive services.     Emeterio Reeve, DO   05/28/16   Visit summary with medication list and pertinent instructions was printed for patient to review. All questions at time of visit were answered - patient instructed to contact office with any additional concerns. ER/RTC precautions were reviewed with the patient. Follow-up plan: No Follow-up on file.

## 2016-05-28 NOTE — Patient Instructions (Addendum)
Plan: 1. Recommend new shingles vaccine - see printed prescription 2. Recommend recheck lab wok in 3 months or so, sooner if any problems 3. Blood pressure: we should think about starting a blood pressure medication - we can talk about this more at your next visit if the blood pressure is still high 4. Continue home balance and strength exercises 5. Look over pink MOST form with your family - if you'd like me to complete that form, please come see me in the office.   Any other questions, let me know!

## 2016-05-30 DIAGNOSIS — C44311 Basal cell carcinoma of skin of nose: Secondary | ICD-10-CM | POA: Diagnosis not present

## 2016-06-06 DIAGNOSIS — M9904 Segmental and somatic dysfunction of sacral region: Secondary | ICD-10-CM | POA: Diagnosis not present

## 2016-06-06 DIAGNOSIS — M791 Myalgia: Secondary | ICD-10-CM | POA: Diagnosis not present

## 2016-06-06 DIAGNOSIS — M9902 Segmental and somatic dysfunction of thoracic region: Secondary | ICD-10-CM | POA: Diagnosis not present

## 2016-06-06 DIAGNOSIS — M545 Low back pain: Secondary | ICD-10-CM | POA: Diagnosis not present

## 2016-06-06 DIAGNOSIS — M9903 Segmental and somatic dysfunction of lumbar region: Secondary | ICD-10-CM | POA: Diagnosis not present

## 2016-06-06 DIAGNOSIS — M9901 Segmental and somatic dysfunction of cervical region: Secondary | ICD-10-CM | POA: Diagnosis not present

## 2016-06-16 ENCOUNTER — Other Ambulatory Visit: Payer: Self-pay | Admitting: Osteopathic Medicine

## 2016-06-17 DIAGNOSIS — I495 Sick sinus syndrome: Secondary | ICD-10-CM | POA: Diagnosis not present

## 2016-06-27 DIAGNOSIS — M9903 Segmental and somatic dysfunction of lumbar region: Secondary | ICD-10-CM | POA: Diagnosis not present

## 2016-06-27 DIAGNOSIS — M9901 Segmental and somatic dysfunction of cervical region: Secondary | ICD-10-CM | POA: Diagnosis not present

## 2016-06-27 DIAGNOSIS — M9904 Segmental and somatic dysfunction of sacral region: Secondary | ICD-10-CM | POA: Diagnosis not present

## 2016-06-27 DIAGNOSIS — M545 Low back pain: Secondary | ICD-10-CM | POA: Diagnosis not present

## 2016-06-27 DIAGNOSIS — M791 Myalgia: Secondary | ICD-10-CM | POA: Diagnosis not present

## 2016-06-27 DIAGNOSIS — M9902 Segmental and somatic dysfunction of thoracic region: Secondary | ICD-10-CM | POA: Diagnosis not present

## 2016-07-02 ENCOUNTER — Ambulatory Visit: Payer: Medicare Other | Admitting: Osteopathic Medicine

## 2016-07-18 DIAGNOSIS — M9902 Segmental and somatic dysfunction of thoracic region: Secondary | ICD-10-CM | POA: Diagnosis not present

## 2016-07-18 DIAGNOSIS — M9904 Segmental and somatic dysfunction of sacral region: Secondary | ICD-10-CM | POA: Diagnosis not present

## 2016-07-18 DIAGNOSIS — M791 Myalgia: Secondary | ICD-10-CM | POA: Diagnosis not present

## 2016-07-18 DIAGNOSIS — M545 Low back pain: Secondary | ICD-10-CM | POA: Diagnosis not present

## 2016-07-18 DIAGNOSIS — M9903 Segmental and somatic dysfunction of lumbar region: Secondary | ICD-10-CM | POA: Diagnosis not present

## 2016-07-18 DIAGNOSIS — M9901 Segmental and somatic dysfunction of cervical region: Secondary | ICD-10-CM | POA: Diagnosis not present

## 2016-07-29 DIAGNOSIS — M9903 Segmental and somatic dysfunction of lumbar region: Secondary | ICD-10-CM | POA: Diagnosis not present

## 2016-07-29 DIAGNOSIS — M791 Myalgia: Secondary | ICD-10-CM | POA: Diagnosis not present

## 2016-07-29 DIAGNOSIS — M9902 Segmental and somatic dysfunction of thoracic region: Secondary | ICD-10-CM | POA: Diagnosis not present

## 2016-07-29 DIAGNOSIS — M9904 Segmental and somatic dysfunction of sacral region: Secondary | ICD-10-CM | POA: Diagnosis not present

## 2016-07-29 DIAGNOSIS — M9901 Segmental and somatic dysfunction of cervical region: Secondary | ICD-10-CM | POA: Diagnosis not present

## 2016-07-29 DIAGNOSIS — M545 Low back pain: Secondary | ICD-10-CM | POA: Diagnosis not present

## 2016-08-08 DIAGNOSIS — M9901 Segmental and somatic dysfunction of cervical region: Secondary | ICD-10-CM | POA: Diagnosis not present

## 2016-08-08 DIAGNOSIS — M791 Myalgia: Secondary | ICD-10-CM | POA: Diagnosis not present

## 2016-08-08 DIAGNOSIS — M9904 Segmental and somatic dysfunction of sacral region: Secondary | ICD-10-CM | POA: Diagnosis not present

## 2016-08-08 DIAGNOSIS — M545 Low back pain: Secondary | ICD-10-CM | POA: Diagnosis not present

## 2016-08-08 DIAGNOSIS — M9902 Segmental and somatic dysfunction of thoracic region: Secondary | ICD-10-CM | POA: Diagnosis not present

## 2016-08-08 DIAGNOSIS — M9903 Segmental and somatic dysfunction of lumbar region: Secondary | ICD-10-CM | POA: Diagnosis not present

## 2016-08-22 DIAGNOSIS — M9901 Segmental and somatic dysfunction of cervical region: Secondary | ICD-10-CM | POA: Diagnosis not present

## 2016-08-22 DIAGNOSIS — M9903 Segmental and somatic dysfunction of lumbar region: Secondary | ICD-10-CM | POA: Diagnosis not present

## 2016-08-22 DIAGNOSIS — M791 Myalgia: Secondary | ICD-10-CM | POA: Diagnosis not present

## 2016-08-22 DIAGNOSIS — M9904 Segmental and somatic dysfunction of sacral region: Secondary | ICD-10-CM | POA: Diagnosis not present

## 2016-08-22 DIAGNOSIS — M545 Low back pain: Secondary | ICD-10-CM | POA: Diagnosis not present

## 2016-08-22 DIAGNOSIS — M9902 Segmental and somatic dysfunction of thoracic region: Secondary | ICD-10-CM | POA: Diagnosis not present

## 2016-08-23 ENCOUNTER — Telehealth: Payer: Self-pay | Admitting: Osteopathic Medicine

## 2016-08-23 DIAGNOSIS — M810 Age-related osteoporosis without current pathological fracture: Secondary | ICD-10-CM

## 2016-08-23 NOTE — Telephone Encounter (Signed)
Pt has an appointment for a Prolia injection on August 13th. She was unsure if she needs labs drawn before getting the injection. Please advise. Thanks!

## 2016-08-23 NOTE — Telephone Encounter (Signed)
Yes. She will need to have a BMP done 30 days prior to having the injection done. Will place order and fax, please call pt.Audelia Hives [place]

## 2016-09-05 DIAGNOSIS — M9903 Segmental and somatic dysfunction of lumbar region: Secondary | ICD-10-CM | POA: Diagnosis not present

## 2016-09-05 DIAGNOSIS — M545 Low back pain: Secondary | ICD-10-CM | POA: Diagnosis not present

## 2016-09-05 DIAGNOSIS — M9904 Segmental and somatic dysfunction of sacral region: Secondary | ICD-10-CM | POA: Diagnosis not present

## 2016-09-05 DIAGNOSIS — M9902 Segmental and somatic dysfunction of thoracic region: Secondary | ICD-10-CM | POA: Diagnosis not present

## 2016-09-05 DIAGNOSIS — M791 Myalgia: Secondary | ICD-10-CM | POA: Diagnosis not present

## 2016-09-05 DIAGNOSIS — M9901 Segmental and somatic dysfunction of cervical region: Secondary | ICD-10-CM | POA: Diagnosis not present

## 2016-09-06 ENCOUNTER — Other Ambulatory Visit: Payer: Self-pay

## 2016-09-13 ENCOUNTER — Other Ambulatory Visit: Payer: Self-pay | Admitting: Osteopathic Medicine

## 2016-09-16 DIAGNOSIS — M810 Age-related osteoporosis without current pathological fracture: Secondary | ICD-10-CM | POA: Diagnosis not present

## 2016-09-16 LAB — BASIC METABOLIC PANEL
BUN: 23 mg/dL (ref 7–25)
CO2: 24 mmol/L (ref 20–31)
Calcium: 9.2 mg/dL (ref 8.6–10.4)
Chloride: 103 mmol/L (ref 98–110)
Creat: 0.77 mg/dL (ref 0.60–0.88)
GLUCOSE: 102 mg/dL — AB (ref 65–99)
POTASSIUM: 4.2 mmol/L (ref 3.5–5.3)
Sodium: 139 mmol/L (ref 135–146)

## 2016-09-18 ENCOUNTER — Other Ambulatory Visit: Payer: Self-pay

## 2016-09-18 DIAGNOSIS — M9902 Segmental and somatic dysfunction of thoracic region: Secondary | ICD-10-CM | POA: Diagnosis not present

## 2016-09-18 DIAGNOSIS — M791 Myalgia: Secondary | ICD-10-CM | POA: Diagnosis not present

## 2016-09-18 DIAGNOSIS — M818 Other osteoporosis without current pathological fracture: Secondary | ICD-10-CM

## 2016-09-18 DIAGNOSIS — M9904 Segmental and somatic dysfunction of sacral region: Secondary | ICD-10-CM | POA: Diagnosis not present

## 2016-09-18 DIAGNOSIS — M9903 Segmental and somatic dysfunction of lumbar region: Secondary | ICD-10-CM | POA: Diagnosis not present

## 2016-09-18 DIAGNOSIS — M545 Low back pain: Secondary | ICD-10-CM | POA: Diagnosis not present

## 2016-09-18 DIAGNOSIS — M9901 Segmental and somatic dysfunction of cervical region: Secondary | ICD-10-CM | POA: Diagnosis not present

## 2016-09-24 DIAGNOSIS — I495 Sick sinus syndrome: Secondary | ICD-10-CM | POA: Diagnosis not present

## 2016-09-30 ENCOUNTER — Ambulatory Visit: Payer: Medicare Other

## 2016-10-07 ENCOUNTER — Ambulatory Visit: Payer: Medicare Other | Admitting: Osteopathic Medicine

## 2016-10-09 ENCOUNTER — Encounter: Payer: Self-pay | Admitting: Osteopathic Medicine

## 2016-10-09 ENCOUNTER — Ambulatory Visit (INDEPENDENT_AMBULATORY_CARE_PROVIDER_SITE_OTHER): Payer: Medicare Other | Admitting: Osteopathic Medicine

## 2016-10-09 VITALS — BP 135/65 | HR 62 | Ht 60.0 in | Wt 139.0 lb

## 2016-10-09 DIAGNOSIS — R030 Elevated blood-pressure reading, without diagnosis of hypertension: Secondary | ICD-10-CM

## 2016-10-09 DIAGNOSIS — Z7189 Other specified counseling: Secondary | ICD-10-CM

## 2016-10-09 NOTE — Progress Notes (Signed)
HPI: Amanda Travis is a 83 y.o. female  who presents to [facility] today, 10/09/16,  for chief complaint of:  Chief Complaint  Patient presents with  . Follow-up    blood pressure    HTN: blood pressure on the high side today. No BP meds. Thinks BP was systolic 202'R recently at the dentist. Blood pressure on recheck was improved, no chest pain, pressure, shortness of breath.  She has some questions about DO NOT RESUSCITATE/MOST form and advanced directive. She had these documents in place in [place] but since moving she wants to update these. We discussed the most form in detail, the patient does not want CPR or intubation under any circumstances, she is aware of the risk of pain, ineffective procedure weight against the benefit of slight chance of life-saving measure/recovery but she does not feel that the small chance of success for these measures is worth going through them at her age and in her overall health. She has discussed this with her daughter.   Past medical history, surgical history, social history and family history reviewed.  Patient Active Problem List   Diagnosis Date Noted  . Advance directive discussed with patient 05/28/2016  . Onychomycosis 05/28/2016  . Elevated blood pressure reading 04/03/2016  . Gastric erosions 12/10/2015  . Atrial fibrillation ([place]) 12/08/2015  . GIB (gastrointestinal bleeding) 12/08/2015  . History of cardiac pacemaker 12/08/2015  . Microcytic hypochromic anemia 12/08/2015  . Intractable cyclical vomiting with nausea 06/23/2015  . Gastroesophageal reflux disease with esophagitis 06/23/2015  . Gait instability 04/19/2015  . Cardiac pacemaker in situ 03/08/2015  . Cough 03/08/2015  . History of glaucoma 03/08/2015  . Palpitations 03/08/2015  . Cardiac risk counseling 03/08/2015  . History of tachycardia-bradycardia syndrome 03/08/2015  . Postmenopausal 03/08/2015  . Osteoporosis 03/08/2015  .  Disorder of bone and cartilage 03/08/2015  . Hyperlipidemia 03/08/2015  . History of skin cancer 03/08/2015    Current medication list and allergy/intolerance information reviewed.   Current Outpatient Prescriptions on File Prior to Visit  Medication Sig Dispense Refill  . apixaban (ELIQUIS) 2.5 MG TABS tablet Take 2.5 mg by mouth 2 (two) times daily.    . ARTIFICIAL TEARS 0.1-0.3 % SOLN Apply to eye as needed.     . Calcium Carbonate-Vitamin D 600-400 MG-UNIT chew tablet Chew 2 tablets by mouth daily. 60 tablet 12  . Echinacea 125 MG CAPS Take by mouth. Take 200 mg by mouth daily    . ferrous sulfate 325 (65 FE) MG tablet Take 325 mg by mouth daily with breakfast.    . Flaxseed, Linseed, (FLAXSEED OIL PO) Take by mouth.    . Garlic 4270 MG TABS Take by mouth. Take 1000 mg by mouth daily    . Glucosamine Sulfate 500 MG TABS Take by mouth. Take 200 mg by mouth daily    . latanoprost (XALATAN) 0.005 % ophthalmic solution 1 drop at bedtime.    Marland Kitchen loratadine (CLARITIN) 10 MG tablet Take 10 mg by mouth daily.    Marland Kitchen Lysine 500 MG CAPS Take by mouth. Take 1000 mg by mouth daily    . Melatonin 5 MG TABS Take by mouth. Take 5 mg by mouth daily    . Multiple Vitamin (MULTIVITAMIN) tablet Take 1 tablet by mouth daily.    Marland Kitchen oxybutynin (DITROPAN) 5 MG tablet Take 1 tablet (5 mg total) by mouth 2 (two) times daily. Due for follow up visit 60 tablet 0  . pantoprazole (PROTONIX)  40 MG tablet Take 40 mg by mouth 2 (two) times daily.    . Policosanol 10 MG CAPS Take by mouth. Take by mouth 2 times daily    . Potassium 99 MG TABS Take by mouth.    . pyridOXINE (VITAMIN B-6) 100 MG tablet Take 100 mg by mouth daily.    . timolol (TIMOPTIC) 0.25 % ophthalmic solution 1 drop 2 (two) times daily.    . vitamin C (ASCORBIC ACID) 500 MG tablet Take 500 mg by mouth daily.     No current facility-administered medications on file prior to visit.    Allergies  Allergen Reactions  . Keflex [Cephalexin]   .  Tetracyclines & Related   . Valtrex [Valacyclovir Hcl]       Review of Systems:  Constitutional: No recent illness, feels well today   Cardiac: No  chest pain  Respiratory:  No  shortness of breath.  Musculoskeletal: No new myalgia/arthralgia  Skin: No  Rash  Neurologic: No  weakness, No  Dizziness   Exam:  BP 135/65   Pulse 62   Ht 5' (1.524 m)   Wt 139 lb (63 kg)   BMI 27.15 kg/m   Constitutional: VS see above. General Appearance: alert, well-developed, well-nourished, NAD  Eyes: Normal lids and conjunctive, non-icteric sclera  Ears, Nose, Mouth, Throat: MMM, Normal external inspection ears/nares/mouth/lips/gums.  Neck: No masses, trachea midline.   Respiratory: Normal respiratory effort. no wheeze, no rhonchi, no rales  Cardiovascular: S1/S2 normal, no rub/gallop auscultated. RRR (hx afib) .   Musculoskeletal: Gait normal. Symmetric and independent movement of all extremities  Neurological: Normal balance/coordination. No tremor.  Skin: warm, dry, intact.   Psychiatric: Normal judgment/insight. Normal mood and affect. Oriented x3.       ASSESSMENT/PLAN: Patient has capacity to make informed decisions about end-of-life care. No terminal diagnosis at this point but she is clear about wishes for DNR/DNI, she is okay with feeding tube for limited trial. DO NOT RESUSCITATE and MOST forms were given to her with original copies to keep an fridge/freezer, we kept copy to scan. Patient was given additional information on advanced directives for [facility], unfortunately there are no bracelets available for this state   Advance directive discussed with patient - Patient has capacity to make informed decisions about end-of-life care. No terminal diagnosis at this point but she is clear about wishes for DNR/DNI  Elevated blood pressure reading - Resolved, continue to monitor at routine visits      Follow-up plan: Return in about 4 months (around 02/08/2017)  for recheck blood pressure and routine labs.  Visit summary with medication list and pertinent instructions was printed for patient to review, alert [place] if any changes needed. All questions at time of visit were answered - patient instructed to contact office with any additional concerns. ER/RTC precautions were reviewed with the patient and understanding verbalized.   Note: Total time spent 25 minutes, greater than 50% of the visit was spent face-to-face counseling and coordinating care for the following: The primary encounter diagnosis was Advance directive discussed with patient. A diagnosis of Elevated blood pressure reading was also pertinent to this visit.Marland Kitchen

## 2016-10-14 DIAGNOSIS — K219 Gastro-esophageal reflux disease without esophagitis: Secondary | ICD-10-CM | POA: Diagnosis not present

## 2016-10-14 DIAGNOSIS — D509 Iron deficiency anemia, unspecified: Secondary | ICD-10-CM | POA: Diagnosis not present

## 2016-10-14 DIAGNOSIS — Z86010 Personal history of colonic polyps: Secondary | ICD-10-CM | POA: Diagnosis not present

## 2016-10-25 ENCOUNTER — Other Ambulatory Visit: Payer: Self-pay

## 2016-10-25 MED ORDER — OXYBUTYNIN CHLORIDE 5 MG PO TABS: 5.0000 mg | ORAL_TABLET | Freq: Two times a day (BID) | ORAL | 0 refills | Status: DC

## 2016-10-25 NOTE — Telephone Encounter (Signed)
Patient request refill for Oxybutynin 5 mg. She requested a 90 day supply. #[phone] refills sent to [facility]. Rhonda Cunningham,CMA

## 2016-11-18 DIAGNOSIS — M818 Other osteoporosis without current pathological fracture: Secondary | ICD-10-CM | POA: Diagnosis not present

## 2016-11-18 LAB — BASIC METABOLIC PANEL WITH GFR
BUN/Creatinine Ratio: 31 (calc) — ABNORMAL HIGH (ref 6–22)
BUN: 26 mg/dL — ABNORMAL HIGH (ref 7–25)
CALCIUM: 8.8 mg/dL (ref 8.6–10.4)
CHLORIDE: 105 mmol/L (ref 98–110)
CO2: 26 mmol/L (ref 20–32)
Creat: 0.84 mg/dL (ref 0.60–0.88)
GFR, EST AFRICAN AMERICAN: 73 mL/min/{1.73_m2} (ref >=60)
GFR, Est Non African American: 63 mL/min/{1.73_m2} (ref >=60)
GLUCOSE: 108 mg/dL — AB (ref 65–99)
POTASSIUM: 4.1 mmol/L (ref 3.5–5.3)
Sodium: 139 mmol/L (ref 135–146)

## 2016-11-26 ENCOUNTER — Ambulatory Visit (INDEPENDENT_AMBULATORY_CARE_PROVIDER_SITE_OTHER): Payer: Medicare Other | Admitting: Physician Assistant

## 2016-11-26 VITALS — BP 122/57 | HR 53

## 2016-11-26 DIAGNOSIS — Z23 Encounter for immunization: Secondary | ICD-10-CM

## 2016-11-26 DIAGNOSIS — M818 Other osteoporosis without current pathological fracture: Secondary | ICD-10-CM | POA: Diagnosis not present

## 2016-11-26 MED ORDER — DENOSUMAB 60 MG/ML ~~LOC~~ SOLN
60.0000 mg | Freq: Once | SUBCUTANEOUS | Status: AC
  Administered 2016-11-26: 60 mg via SUBCUTANEOUS

## 2016-11-26 NOTE — Progress Notes (Signed)
Patient came into clinic today for Prolia injection and flu vaccination. Patient reports no negative side effects with Prolia. She had her labs completed prior to appointment today. Patient tolerated Prolia injection in left arm well, no immediate complication. Patient advised to get her labs completed prior to next Prolia injection in 6 months, verbalized understanding. Patient was given a flu shot questionnaire prior to administration of immunization. All questions were answered no. Patient tolerated injection of flu immunization in right deltoid well, with no immediate complications. An information pamphlet regarding flu shot immunization and frequently asked questions was given to Patient prior to leaving clinic. Advised to contact our office with any questions/concerns.   Agree with the above plan. Iran Planas PA-C

## 2016-12-03 DIAGNOSIS — L570 Actinic keratosis: Secondary | ICD-10-CM | POA: Diagnosis not present

## 2016-12-03 DIAGNOSIS — Z08 Encounter for follow-up examination after completed treatment for malignant neoplasm: Secondary | ICD-10-CM | POA: Diagnosis not present

## 2016-12-03 DIAGNOSIS — Z85828 Personal history of other malignant neoplasm of skin: Secondary | ICD-10-CM | POA: Diagnosis not present

## 2016-12-31 DIAGNOSIS — Z45018 Encounter for adjustment and management of other part of cardiac pacemaker: Secondary | ICD-10-CM | POA: Diagnosis not present

## 2016-12-31 DIAGNOSIS — Z45010 Encounter for checking and testing of cardiac pacemaker pulse generator [battery]: Secondary | ICD-10-CM | POA: Diagnosis not present

## 2017-01-22 ENCOUNTER — Other Ambulatory Visit: Payer: Self-pay | Admitting: Osteopathic Medicine

## 2017-02-19 ENCOUNTER — Ambulatory Visit (INDEPENDENT_AMBULATORY_CARE_PROVIDER_SITE_OTHER): Payer: Medicare Other | Admitting: Osteopathic Medicine

## 2017-02-19 ENCOUNTER — Encounter: Payer: Self-pay | Admitting: Osteopathic Medicine

## 2017-02-19 VITALS — BP 99/60 | HR 60 | Wt 140.1 lb

## 2017-02-19 DIAGNOSIS — D649 Anemia, unspecified: Secondary | ICD-10-CM | POA: Diagnosis not present

## 2017-02-19 DIAGNOSIS — R030 Elevated blood-pressure reading, without diagnosis of hypertension: Secondary | ICD-10-CM

## 2017-02-19 DIAGNOSIS — Z8669 Personal history of other diseases of the nervous system and sense organs: Secondary | ICD-10-CM | POA: Diagnosis not present

## 2017-02-19 DIAGNOSIS — M818 Other osteoporosis without current pathological fracture: Secondary | ICD-10-CM | POA: Diagnosis not present

## 2017-02-19 NOTE — Progress Notes (Signed)
HPI: Amanda Travis is a 84 y.o. female who  has a past medical history of Cardiac pacemaker in situ (03/08/2015), Cardiac risk counseling (03/08/2015), Cough (03/08/2015), Disorder of bone and cartilage (03/08/2015), Gait instability (04/19/2015), History of glaucoma (03/08/2015), History of skin cancer (03/08/2015), History of tachycardia-bradycardia syndrome (03/08/2015), Hyperlipidemia (03/08/2015), Osteoporosis (03/08/2015), Palpitations (03/08/2015), and Postmenopausal (03/08/2015).  she presents to [facility] today, 02/19/17,  for chief complaint of:  Chief Complaint  Patient presents with  . Follow-up    BP check: checking home measurements, typically <130/80, occasionally above goal, she is not rechecking 5 mins later. No CP/SOB.   Anemia - no additional s/s bleeding, anemia has been stable  Osteoporosis - doing well on Prolia    Patient is accompanied by grandson who assists with history-taking.   Past medical, surgical, social and family history reviewed:  Patient Active Problem List   Diagnosis Date Noted  . Advance directive discussed with patient 05/28/2016  . Onychomycosis 05/28/2016  . Elevated blood pressure reading 04/03/2016  . Gastric erosions 12/10/2015  . Atrial fibrillation ([place]) 12/08/2015  . GIB (gastrointestinal bleeding) 12/08/2015  . History of cardiac pacemaker 12/08/2015  . Microcytic hypochromic anemia 12/08/2015  . Intractable cyclical vomiting with nausea 06/23/2015  . Gastroesophageal reflux disease with esophagitis 06/23/2015  . Gait instability 04/19/2015  . Cardiac pacemaker in situ 03/08/2015  . Cough 03/08/2015  . History of glaucoma 03/08/2015  . Palpitations 03/08/2015  . Cardiac risk counseling 03/08/2015  . History of tachycardia-bradycardia syndrome 03/08/2015  . Postmenopausal 03/08/2015  . Osteoporosis 03/08/2015  . Disorder of bone and cartilage 03/08/2015  . Hyperlipidemia 03/08/2015  . History of skin  cancer 03/08/2015    Past Surgical History:  Procedure Laterality Date  . ABDOMINAL HYSTERECTOMY  1979  . APPENDECTOMY    . BLOOD CLOT  2006  . CHOLECYSTECTOMY    . COLON SURGERY  1998  . EYE SURGERY     CATARACT  . JOINT REPLACEMENT Bilateral    KNEE  . MOHS SURGERY  2015  . OVARIAN CANCER  1997  . TOOTH EXTRACTION      Social History   Tobacco Use  . Smoking status: Never Smoker  . Smokeless tobacco: Never Used  Substance Use Topics  . Alcohol use: No    Family History  Problem Relation Age of Onset  . Stroke Mother   . Heart disease Sister   . Dementia Brother   . Heart disease Brother      Current medication list and allergy/intolerance information reviewed:    Current Outpatient Medications  Medication Sig Dispense Refill  . apixaban (ELIQUIS) 2.5 MG TABS tablet Take 2.5 mg by mouth 2 (two) times daily.    . ARTIFICIAL TEARS 0.1-0.3 % SOLN Apply to eye as needed.     . Calcium Carbonate-Vitamin D 600-400 MG-UNIT chew tablet Chew 2 tablets by mouth daily. 60 tablet 12  . Echinacea 125 MG CAPS Take by mouth. Take 200 mg by mouth daily    . Flaxseed, Linseed, (FLAXSEED OIL PO) Take by mouth.    . Garlic 8786 MG TABS Take by mouth. Take 1000 mg by mouth daily    . Glucosamine Sulfate 500 MG TABS Take by mouth. Take 200 mg by mouth daily    . latanoprost (XALATAN) 0.005 % ophthalmic solution 1 drop at bedtime.    Marland Kitchen loratadine (CLARITIN) 10 MG tablet Take 10 mg by mouth daily.    Marland Kitchen Lysine 500 MG  CAPS Take by mouth. Take 1000 mg by mouth daily    . Melatonin 5 MG TABS Take by mouth. Take 5 mg by mouth daily    . Multiple Vitamin (MULTIVITAMIN) tablet Take 1 tablet by mouth daily.    Marland Kitchen oxybutynin (DITROPAN) 5 MG tablet TAKE 1 TABLET(5 MG) BY MOUTH TWICE DAILY 180 tablet 0  . pantoprazole (PROTONIX) 40 MG tablet Take 40 mg by mouth daily.     . Policosanol 10 MG CAPS Take by mouth. Take by mouth 2 times daily    . Potassium 99 MG TABS Take by mouth.    . pyridOXINE  (VITAMIN B-6) 100 MG tablet Take 100 mg by mouth daily.    . timolol (TIMOPTIC) 0.25 % ophthalmic solution 1 drop 2 (two) times daily.    . vitamin C (ASCORBIC ACID) 500 MG tablet Take 500 mg by mouth daily.     No current facility-administered medications for this visit.     Allergies  Allergen Reactions  . Keflex [Cephalexin]   . Tetracyclines & Related   . Valtrex [Valacyclovir Hcl]       Review of Systems:  Constitutional:  No  fever, no chills, No recent illness  HEENT: No  headache, no vision change  Cardiac: No  chest pain, No  pressure, No palpitations, No  Orthopnea  Respiratory:  No  shortness of breath. No  Cough  Gastrointestinal: No  abdominal pain, No  nausea  Musculoskeletal: No new myalgia/arthralgia  Neurologic: No  weakness, No  Dizziness    Exam:  BP 99/60   Pulse 60   Wt 140 lb 1.9 oz (63.6 kg)   BMI 27.37 kg/m   Constitutional: VS see above. General Appearance: alert, well-developed, well-nourished, NAD  Eyes: Normal lids and conjunctive, non-icteric sclera  Ears, Nose, Mouth, Throat: MMM, Normal external inspection ears/nares/mouth/lips/gums. TM normal bilaterally.   Neck: No masses, trachea midline. No thyroid enlargement  Respiratory: Normal respiratory effort. no wheeze, no rhonchi, no rales  Cardiovascular: S1/S2 normal, no murmur, no rub/gallop auscultated. RRR. No lower extremity edema.   Musculoskeletal: Gait normal.  Neurological: Normal balance/coordination. No tremor.   Skin: warm, dry, intact. No rash/ulcer.  Psychiatric: Normal judgment/insight. Normal mood and affect. Oriented x3.     ASSESSMENT/PLAN:   Elevated blood pressure reading - Plan: COMPLETE METABOLIC PANEL WITH GFR, Lipid panel  Other osteoporosis, unspecified pathological fracture presence - Plan: VITAMIN D 25 Hydroxy (Vit-D Deficiency, Fractures)  Anemia, unspecified type - Plan: CBC  History of glaucoma    Patient Instructions  Plan:  Come  back for fasting blood work - if all is ok and blood pressure stays under 140/90 at home, nothing else to do for awhile!   Next time you're here, bring your blood pressure monitor that you're using at home, so we can check it against our equipment for accuracy     Visit summary with medication list and pertinent instructions was printed for patient to review. All questions at time of visit were answered - patient instructed to contact office with any additional concerns. ER/RTC precautions were reviewed with the patient.   Follow-up plan: Return in about 4 months (around 06/19/2017) for medicare wellness visit, sooner if needed .  Note: Total time spent 25 minutes, greater than 50% of the visit was spent face-to-face counseling and coordinating care for the following: The primary encounter diagnosis was Essential hypertension. Diagnoses of Elevated blood pressure reading, Other osteoporosis, unspecified pathological fracture presence, Anemia, unspecified type, and  History of glaucoma were also pertinent to this visit.Marland Kitchen  Please note: voice recognition software was used to produce this document, and typos may escape review. Please contact Dr. Sheppard Coil for any needed clarifications.

## 2017-02-19 NOTE — Patient Instructions (Signed)
Plan:  Come back for fasting blood work - if all is ok and blood pressure stays under 140/90 at home, nothing else to do for awhile!   Next time you're here, bring your blood pressure monitor that you're using at home, so we can check it against our equipment for accuracy

## 2017-02-20 DIAGNOSIS — I10 Essential (primary) hypertension: Secondary | ICD-10-CM | POA: Diagnosis not present

## 2017-02-20 DIAGNOSIS — M818 Other osteoporosis without current pathological fracture: Secondary | ICD-10-CM | POA: Diagnosis not present

## 2017-02-20 DIAGNOSIS — D649 Anemia, unspecified: Secondary | ICD-10-CM | POA: Diagnosis not present

## 2017-02-20 DIAGNOSIS — R030 Elevated blood-pressure reading, without diagnosis of hypertension: Secondary | ICD-10-CM | POA: Diagnosis not present

## 2017-02-21 LAB — COMPLETE METABOLIC PANEL WITH GFR
AG Ratio: 1.5 (calc) (ref 1.0–2.5)
ALT: 10 U/L (ref 6–29)
AST: 18 U/L (ref 10–35)
Albumin: 4.1 g/dL (ref 3.6–5.1)
Alkaline phosphatase (APISO): 52 U/L (ref 33–130)
BUN: 21 mg/dL (ref 7–25)
CALCIUM: 9.5 mg/dL (ref 8.6–10.4)
CHLORIDE: 102 mmol/L (ref 98–110)
CO2: 29 mmol/L (ref 20–32)
Creat: 0.84 mg/dL (ref 0.60–0.88)
GFR, EST AFRICAN AMERICAN: 73 mL/min/{1.73_m2} (ref >=60)
GFR, EST NON AFRICAN AMERICAN: 63 mL/min/{1.73_m2} (ref >=60)
GLUCOSE: 100 mg/dL — AB (ref 65–99)
Globulin: 2.7 g/dL (calc) (ref 1.9–3.7)
Potassium: 4.6 mmol/L (ref 3.5–5.3)
Sodium: 139 mmol/L (ref 135–146)
TOTAL PROTEIN: 6.8 g/dL (ref 6.1–8.1)
Total Bilirubin: 1.2 mg/dL (ref 0.2–1.2)

## 2017-02-21 LAB — LIPID PANEL
Cholesterol: 223 mg/dL — ABNORMAL HIGH (ref <200)
HDL: 77 mg/dL (ref >50)
LDL CHOLESTEROL (CALC): 126 mg/dL — AB
Non-HDL Cholesterol (Calc): 146 mg/dL (calc) — ABNORMAL HIGH (ref <130)
TRIGLYCERIDES: 102 mg/dL (ref <150)
Total CHOL/HDL Ratio: 2.9 (calc) (ref <5.0)

## 2017-02-21 LAB — CBC
HCT: 45.6 % — ABNORMAL HIGH (ref 35.0–45.0)
Hemoglobin: 15.2 g/dL (ref 11.7–15.5)
MCH: 30.5 pg (ref 27.0–33.0)
MCHC: 33.3 g/dL (ref 32.0–36.0)
MCV: 91.6 fL (ref 80.0–100.0)
MPV: 9.5 fL (ref 7.5–12.5)
PLATELETS: 226 10*3/uL (ref 140–400)
RBC: 4.98 10*6/uL (ref 3.80–5.10)
RDW: 12.8 % (ref 11.0–15.0)
WBC: 6.3 10*3/uL (ref 3.8–10.8)

## 2017-02-21 LAB — VITAMIN D 25 HYDROXY (VIT D DEFICIENCY, FRACTURES): Vit D, 25-Hydroxy: 39 ng/mL (ref 30–100)

## 2017-04-01 DIAGNOSIS — H26493 Other secondary cataract, bilateral: Secondary | ICD-10-CM | POA: Diagnosis not present

## 2017-04-01 DIAGNOSIS — H527 Unspecified disorder of refraction: Secondary | ICD-10-CM | POA: Diagnosis not present

## 2017-04-04 DIAGNOSIS — I495 Sick sinus syndrome: Secondary | ICD-10-CM | POA: Diagnosis not present

## 2017-04-04 DIAGNOSIS — Z950 Presence of cardiac pacemaker: Secondary | ICD-10-CM | POA: Diagnosis not present

## 2017-04-04 DIAGNOSIS — I480 Paroxysmal atrial fibrillation: Secondary | ICD-10-CM | POA: Diagnosis not present

## 2017-04-08 DIAGNOSIS — I480 Paroxysmal atrial fibrillation: Secondary | ICD-10-CM | POA: Diagnosis not present

## 2017-04-08 DIAGNOSIS — Z45018 Encounter for adjustment and management of other part of cardiac pacemaker: Secondary | ICD-10-CM | POA: Diagnosis not present

## 2017-04-08 DIAGNOSIS — Z45010 Encounter for checking and testing of cardiac pacemaker pulse generator [battery]: Secondary | ICD-10-CM | POA: Diagnosis not present

## 2017-04-20 ENCOUNTER — Other Ambulatory Visit: Payer: Self-pay | Admitting: Osteopathic Medicine

## 2017-04-22 DIAGNOSIS — D509 Iron deficiency anemia, unspecified: Secondary | ICD-10-CM | POA: Diagnosis not present

## 2017-05-13 ENCOUNTER — Other Ambulatory Visit: Payer: Self-pay

## 2017-05-13 ENCOUNTER — Telehealth: Payer: Self-pay | Admitting: Osteopathic Medicine

## 2017-05-13 DIAGNOSIS — M818 Other osteoporosis without current pathological fracture: Secondary | ICD-10-CM | POA: Diagnosis not present

## 2017-05-13 NOTE — Telephone Encounter (Signed)
Ordered BMP. Patient advised to go to the lab.

## 2017-05-13 NOTE — Telephone Encounter (Signed)
Pt states she is due for Prolia inj around 05-28-17 and would like to get this scheduled

## 2017-05-14 LAB — BASIC METABOLIC PANEL
BUN: 25 mg/dL (ref 7–25)
CO2: 26 mmol/L (ref 20–32)
CREATININE: 0.84 mg/dL (ref 0.60–0.88)
Calcium: 9.4 mg/dL (ref 8.6–10.4)
Chloride: 101 mmol/L (ref 98–110)
Glucose, Bld: 99 mg/dL (ref 65–99)
POTASSIUM: 4.5 mmol/L (ref 3.5–5.3)
Sodium: 138 mmol/L (ref 135–146)

## 2017-05-19 NOTE — Telephone Encounter (Signed)
Labs completed. Pt is OK to proceed with injection.  Routing for PA on Prolia before Nurse Visit for clinic administration is scheduled.

## 2017-05-19 NOTE — Telephone Encounter (Signed)
Information has been sent to Insurance and awaiting information.

## 2017-05-27 ENCOUNTER — Telehealth: Payer: Self-pay

## 2017-05-27 NOTE — Telephone Encounter (Signed)
Sent further information to [employer]. Patient is aware we will call her when we receive a approval or denial.

## 2017-05-30 NOTE — Telephone Encounter (Signed)
Received approval from insurance company for [employer]. Prolia ordered from pharmacy. Pt advised. She will call clinic on Monday to schedule NV.

## 2017-06-03 ENCOUNTER — Ambulatory Visit (INDEPENDENT_AMBULATORY_CARE_PROVIDER_SITE_OTHER): Payer: Medicare Other | Admitting: Family Medicine

## 2017-06-03 VITALS — BP 129/62 | HR 59 | Temp 98.4°F

## 2017-06-03 DIAGNOSIS — M818 Other osteoporosis without current pathological fracture: Secondary | ICD-10-CM | POA: Diagnosis not present

## 2017-06-03 MED ORDER — DENOSUMAB 60 MG/ML ~~LOC~~ SOLN
60.0000 mg | Freq: Once | SUBCUTANEOUS | Status: AC
  Administered 2017-06-03: 60 mg via SUBCUTANEOUS

## 2017-06-03 NOTE — Progress Notes (Signed)
Pt is here for Prolia injection. Pt reports taking calcium and vitamin D supplements daily. Pt's calcium levels & kidney function was within normal limits. Pt denies SOB, chest pain, lightheadedness, dizziness or palpitations. No new concerns for pt today. Pt's blood pressure reading was 155 /66, pulse 59 on Right Arm. After sitting for 10 minutes, pt's bp was rechecked. BP reading was 129/62, pulse 59. Pt tolerated Prolia injection well without complications on Right arm. Pt was advised to schedule next injection in 6 months & is aware we will call with any updates or changes.

## 2017-06-17 ENCOUNTER — Ambulatory Visit (INDEPENDENT_AMBULATORY_CARE_PROVIDER_SITE_OTHER): Payer: Medicare Other | Admitting: Osteopathic Medicine

## 2017-06-17 ENCOUNTER — Encounter: Payer: Self-pay | Admitting: Osteopathic Medicine

## 2017-06-17 VITALS — BP 145/75 | HR 71 | Temp 98.3°F | Wt 142.8 lb

## 2017-06-17 DIAGNOSIS — R030 Elevated blood-pressure reading, without diagnosis of hypertension: Secondary | ICD-10-CM | POA: Diagnosis not present

## 2017-06-17 DIAGNOSIS — Z0000 Encounter for general adult medical examination without abnormal findings: Secondary | ICD-10-CM | POA: Diagnosis not present

## 2017-06-17 DIAGNOSIS — Z7189 Other specified counseling: Secondary | ICD-10-CM | POA: Diagnosis not present

## 2017-06-17 DIAGNOSIS — I482 Chronic atrial fibrillation: Secondary | ICD-10-CM | POA: Diagnosis not present

## 2017-06-17 DIAGNOSIS — R262 Difficulty in walking, not elsewhere classified: Secondary | ICD-10-CM | POA: Diagnosis not present

## 2017-06-17 DIAGNOSIS — I4821 Permanent atrial fibrillation: Secondary | ICD-10-CM

## 2017-06-17 DIAGNOSIS — M810 Age-related osteoporosis without current pathological fracture: Secondary | ICD-10-CM

## 2017-06-17 NOTE — Patient Instructions (Signed)
2

## 2017-06-17 NOTE — Progress Notes (Signed)
Subjective:   Amanda Travis is a 84 y.o. female who presents for Medicare Annual (Subsequent) preventive examination.  HTN: controlled on current meds. Her monitor reads a bit higher than our.  Her monitor 161/69, ours 154/56  Home BP readings vary: Systolic 009-381, Diastolic 82-99. No dizziness or lightheaded    Review of Systems:  No concerns today - 12-point ROS negative        Objective:     Vitals: BP (!) 145/75   Pulse 71   Temp 98.3 F (36.8 C) (Oral)   Wt 142 lb 12.8 oz (64.8 kg)   BMI 27.89 kg/m     Advanced Directives 04/07/2015 03/08/2015  Does Patient Have a Medical Advance Directive? Yes Yes  Type of Advance Directive - [place]  Does patient want to make changes to medical advance directive? - No - Patient declined  Copy of [place] in Chart? No - copy requested No - copy requested    Tobacco Social History   Tobacco Use  Smoking Status Never Smoker  Smokeless Tobacco Never Used     Counseling given: Not Answered   Clinical Intake: No concerns on MMSE. (+)Fall assessment    Past Medical History:  Diagnosis Date  . Cardiac pacemaker in situ 03/08/2015   Placed for Tachy/Brady syndrome   . Cardiac risk counseling 03/08/2015   Await lipids ot calculate ASCVD risk score. No hx known MI/CVA   . Cough 03/08/2015  . Disorder of bone and cartilage 03/08/2015  . Gait instability 04/19/2015  . History of glaucoma 03/08/2015  . History of skin cancer 03/08/2015  . History of tachycardia-bradycardia syndrome 03/08/2015  . Hyperlipidemia 03/08/2015  . Osteoporosis 03/08/2015   previously on Fosamax, pt told to stop this due to dental procedure. Will repeat DEXA.    Marland Kitchen Palpitations 03/08/2015   Pt concerned for Afib.CHA2DDS2-VASc Score 3.2%. Pt reports pacemaker check revealed AFib but I don't see Afib on PM reports, EKG sinus   . Postmenopausal 03/08/2015   Past Surgical History:  Procedure Laterality Date  . ABDOMINAL  HYSTERECTOMY  1979  . APPENDECTOMY    . BLOOD CLOT  2006  . CHOLECYSTECTOMY    . COLON SURGERY  1998  . EYE SURGERY     CATARACT  . JOINT REPLACEMENT Bilateral    KNEE  . MOHS SURGERY  2015  . OVARIAN CANCER  1997  . TOOTH EXTRACTION     Family History  Problem Relation Age of Onset  . Stroke Mother   . Heart disease Sister   . Dementia Brother   . Heart disease Brother    Social History   Socioeconomic History  . Marital status: Widowed    Spouse name: Not on file  . Number of children: Not on file  . Years of education: Not on file  . Highest education level: Not on file  Occupational History  . Not on file  Social Needs  . Financial resource strain: Not on file  . Food insecurity:    Worry: Not on file    Inability: Not on file  . Transportation needs:    Medical: Not on file    Non-medical: Not on file  Tobacco Use  . Smoking status: Never Smoker  . Smokeless tobacco: Never Used  Substance and Sexual Activity  . Alcohol use: No  . Drug use: No  . Sexual activity: Never  Lifestyle  . Physical activity:    Days per week:  Not on file    Minutes per session: Not on file  . Stress: Not on file  Relationships  . Social connections:    Talks on phone: Not on file    Gets together: Not on file    Attends religious service: Not on file    Active member of club or organization: Not on file    Attends meetings of clubs or organizations: Not on file    Relationship status: Not on file  Other Topics Concern  . Not on file  Social History Narrative  . Not on file    Outpatient Encounter Medications as of 06/17/2017  Medication Sig  . apixaban (ELIQUIS) 2.5 MG TABS tablet Take 2.5 mg by mouth 2 (two) times daily.  . ARTIFICIAL TEARS 0.1-0.3 % SOLN Apply to eye as needed.   . Calcium Carbonate-Vitamin D 600-400 MG-UNIT chew tablet Chew 2 tablets by mouth daily.  . Echinacea 125 MG CAPS Take by mouth. Take 200 mg by mouth daily  . Flaxseed, Linseed, (FLAXSEED  OIL PO) Take by mouth.  . Garlic 0623 MG TABS Take by mouth. Take 1000 mg by mouth daily  . Glucosamine Sulfate 500 MG TABS Take by mouth. Take 200 mg by mouth daily  . latanoprost (XALATAN) 0.005 % ophthalmic solution 1 drop at bedtime.  Marland Kitchen loratadine (CLARITIN) 10 MG tablet Take 10 mg by mouth daily.  Marland Kitchen Lysine 500 MG CAPS Take by mouth. Take 1000 mg by mouth daily  . Melatonin 5 MG TABS Take by mouth. Take 5 mg by mouth daily  . Multiple Vitamin (MULTIVITAMIN) tablet Take 1 tablet by mouth daily.  Marland Kitchen oxybutynin (DITROPAN) 5 MG tablet TAKE 1 TABLET(5 MG) BY MOUTH TWICE DAILY  . pantoprazole (PROTONIX) 40 MG tablet Take 40 mg by mouth daily.   . Policosanol 10 MG CAPS Take by mouth. Take by mouth 2 times daily  . Potassium 99 MG TABS Take by mouth.  . pyridOXINE (VITAMIN B-6) 100 MG tablet Take 100 mg by mouth daily.  . timolol (TIMOPTIC) 0.25 % ophthalmic solution 1 drop 2 (two) times daily.  . vitamin C (ASCORBIC ACID) 500 MG tablet Take 500 mg by mouth daily.   No facility-administered encounter medications on file as of 06/17/2017.     Activities of Daily Living: no major concerns. One fall earlier this year. No injury. Trouble with stairs. Wears hearing aids. No depression concerns.    Patient Care Team: Emeterio Reeve, DO as PCP - General (Osteopathic Medicine)   Dr Rachael Darby [place] - dentist  Dr Hassell Done - [facility] last seen 04/2017, CBC ok Dr Mauricio Po - cardiology last seen 03/2017     Assessment:   This is a routine wellness examination for Amanda Travis.  Exercise Activities and Dietary recommendations    Goals    None      Fall Risk Fall Risk  09/06/2016 04/05/2015  Falls in the past year? No No  Comment Emmi Telephone Survey: data to providers prior to load -   Is the patient's home free of loose throw rugs in walkways, pet beds, electrical cords, etc?   yes      Grab bars in the bathroom? no      Handrails on the stairs?   yes      Adequate  lighting?   yes  Timed Get Up and Go performed: no concerns   Depression Screen PHQ 2/9 Scores 10/09/2016 04/02/2016 04/05/2015  PHQ - 2 Score 0 0  0  PHQ- 9 Score 0 0 -     Cognitive Function no concerns on MMSE         Immunization History  Administered Date(s) Administered  . Influenza Split 12/08/2014, 12/12/2015  . Influenza, High Dose Seasonal PF 11/26/2016  . Influenza, Seasonal, Injecte, Preservative Fre 12/08/2014, 12/12/2015  . Influenza-Unspecified 12/08/2014, 12/12/2015  . Pneumococcal Conjugate-13 10/19/2013  . Pneumococcal Polysaccharide-23 12/08/2014  . Tdap 04/05/2015  . Zoster 10/21/2013    Qualifies for Shingles Vaccine? Yes for updated Shingrix   Screening Tests Health Maintenance  Topic Date Due  . INFLUENZA VACCINE  09/18/2017  . TETANUS/TDAP  04/04/2025  . DEXA SCAN  Completed  . PNA vac Low Risk Adult  Completed    Cancer Screenings: Lung: Low Dose CT Chest recommended if Age 66-80 years, 30 pack-year currently smoking OR have quit w/in 15years. Patient does not qualify. Breast:  Up to date on Mammogram? Yes   Up to date of Bone Density/Dexa? Yes Colorectal: n/a reports normal prior to age 80   Additional Screenings: Hepatitis C Screening: n/a     Plan:   Doing well, overall stable.   I have personally reviewed and noted the following in the patient's chart:   . Medical and social history . Use of alcohol, tobacco or illicit drugs  . Current medications and supplements . Functional ability and status . Nutritional status . Physical activity . Advanced directives . List of other physicians . Hospitalizations, surgeries, and ER visits in previous 12 months . Vitals . Screenings to include cognitive, depression, and falls . Referrals and appointments  In addition, I have reviewed and discussed with patient certain preventive protocols, quality metrics, and best practice recommendations. A written personalized care plan for preventive  services as well as general preventive health recommendations were provided to patient.     Emeterio Reeve, DO  06/17/2017

## 2017-06-24 ENCOUNTER — Ambulatory Visit (INDEPENDENT_AMBULATORY_CARE_PROVIDER_SITE_OTHER): Payer: Medicare Other

## 2017-06-24 DIAGNOSIS — M85852 Other specified disorders of bone density and structure, left thigh: Secondary | ICD-10-CM | POA: Diagnosis not present

## 2017-06-24 DIAGNOSIS — Z780 Asymptomatic menopausal state: Secondary | ICD-10-CM | POA: Diagnosis not present

## 2017-06-24 DIAGNOSIS — M85851 Other specified disorders of bone density and structure, right thigh: Secondary | ICD-10-CM

## 2017-06-25 ENCOUNTER — Encounter: Payer: Self-pay | Admitting: Physician Assistant

## 2017-06-25 DIAGNOSIS — M8580 Other specified disorders of bone density and structure, unspecified site: Secondary | ICD-10-CM | POA: Insufficient documentation

## 2017-06-25 HISTORY — DX: Other specified disorders of bone density and structure, unspecified site: M85.80

## 2017-06-25 NOTE — Progress Notes (Signed)
Bone density scan shows osteopenia, which is decreased bone mass There is an increased risk of fractures Recommend continuing daily calcium and vitamin D supplementation Option to do Prolia, which is an injection every 6 months to prevent fractures Would recommend discussing risks versus benefits with PCP

## 2017-07-24 ENCOUNTER — Other Ambulatory Visit: Payer: Self-pay | Admitting: Osteopathic Medicine

## 2017-08-11 DIAGNOSIS — I480 Paroxysmal atrial fibrillation: Secondary | ICD-10-CM | POA: Diagnosis not present

## 2017-08-11 DIAGNOSIS — Z45010 Encounter for checking and testing of cardiac pacemaker pulse generator [battery]: Secondary | ICD-10-CM | POA: Diagnosis not present

## 2017-08-11 DIAGNOSIS — Z45018 Encounter for adjustment and management of other part of cardiac pacemaker: Secondary | ICD-10-CM | POA: Diagnosis not present

## 2017-10-03 DIAGNOSIS — H401132 Primary open-angle glaucoma, bilateral, moderate stage: Secondary | ICD-10-CM | POA: Diagnosis not present

## 2017-10-06 IMAGING — CR DG CHEST 2V
2 series · 2 of 2 positions shown · non-contrast
Comparison: None.

CLINICAL DATA: Cough for 1 week

EXAM:
CHEST  2 VIEW

[chest pa]
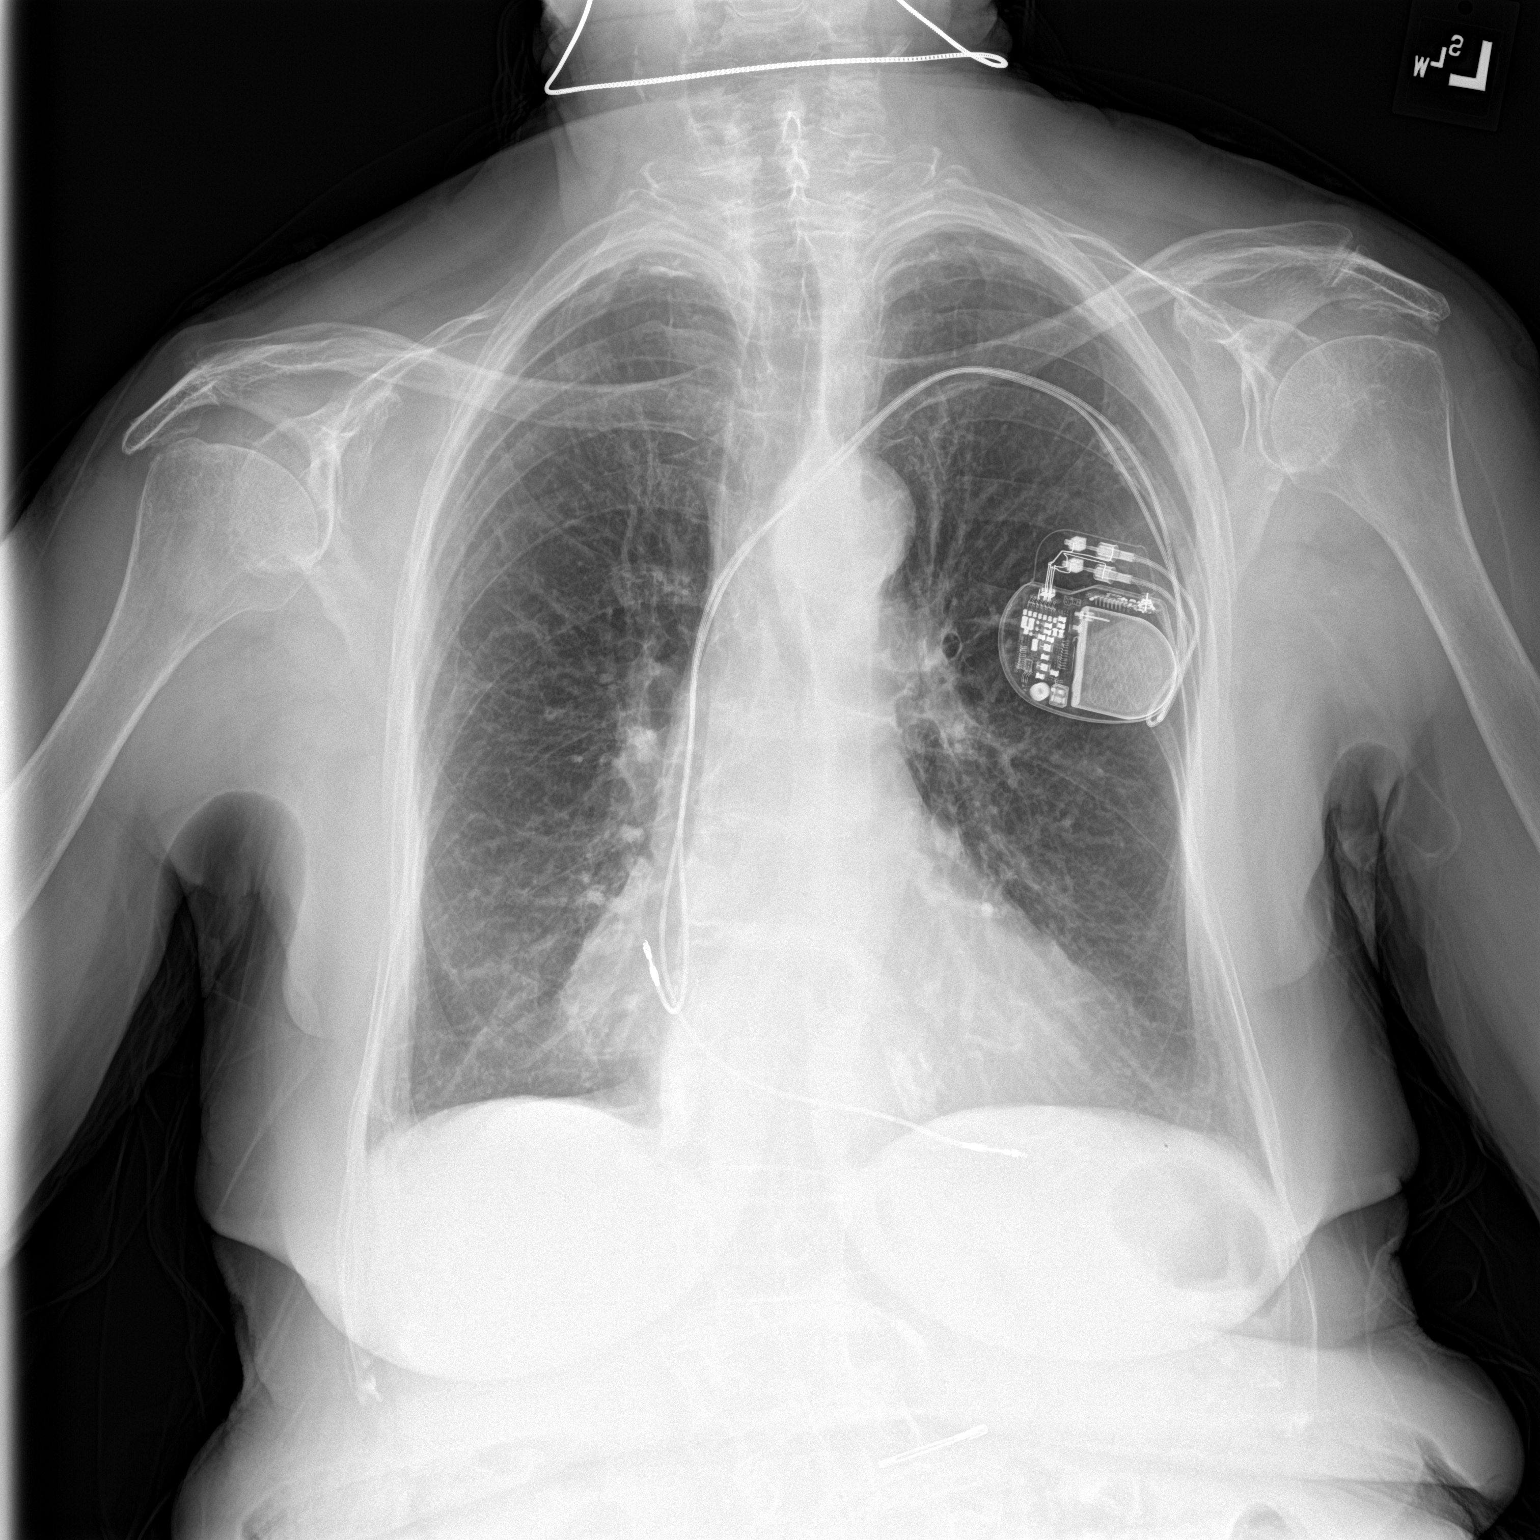

[chest lat]
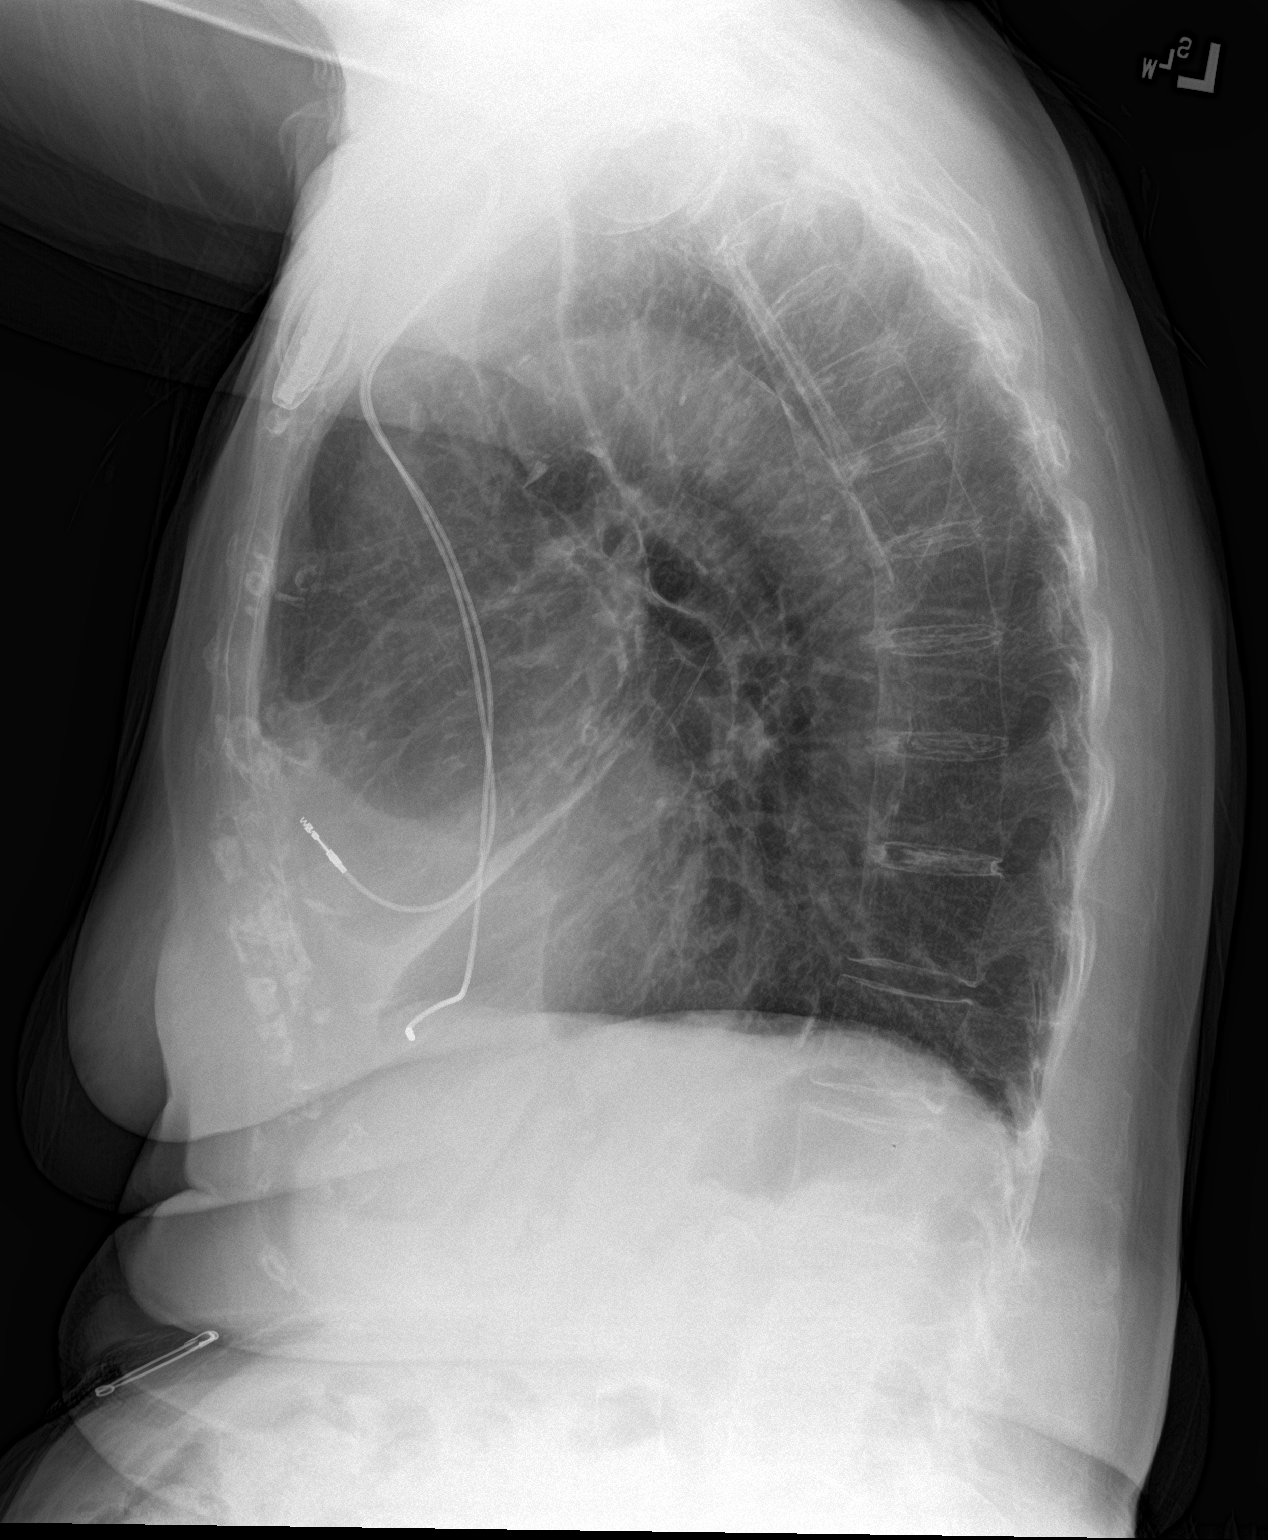

[2 of 2 positions shown; findings below may reference images not displayed]

FINDINGS: There is hyperinflation of the lungs compatible with COPD.
Cardiomegaly. Left pacer in place with leads in the right atrium and
right ventricle. Lungs are clear. No effusions. No acute bony
abnormality.
IMPRESSION: COPD, cardiomegaly.  No active disease.

## 2017-10-21 ENCOUNTER — Other Ambulatory Visit: Payer: Self-pay | Admitting: Osteopathic Medicine

## 2017-10-30 DIAGNOSIS — D649 Anemia, unspecified: Secondary | ICD-10-CM | POA: Diagnosis not present

## 2017-11-13 DIAGNOSIS — G510 Bell's palsy: Secondary | ICD-10-CM | POA: Diagnosis not present

## 2017-11-13 DIAGNOSIS — I6523 Occlusion and stenosis of bilateral carotid arteries: Secondary | ICD-10-CM | POA: Diagnosis not present

## 2017-11-13 DIAGNOSIS — Z881 Allergy status to other antibiotic agents status: Secondary | ICD-10-CM | POA: Diagnosis not present

## 2017-11-13 DIAGNOSIS — R29810 Facial weakness: Secondary | ICD-10-CM | POA: Diagnosis not present

## 2017-11-13 DIAGNOSIS — Z9660 Presence of unspecified orthopedic joint implant: Secondary | ICD-10-CM | POA: Diagnosis not present

## 2017-11-13 DIAGNOSIS — Z79899 Other long term (current) drug therapy: Secondary | ICD-10-CM | POA: Diagnosis not present

## 2017-11-13 DIAGNOSIS — G459 Transient cerebral ischemic attack, unspecified: Secondary | ICD-10-CM | POA: Diagnosis not present

## 2017-11-13 DIAGNOSIS — I4891 Unspecified atrial fibrillation: Secondary | ICD-10-CM | POA: Diagnosis not present

## 2017-11-13 DIAGNOSIS — Z950 Presence of cardiac pacemaker: Secondary | ICD-10-CM | POA: Diagnosis not present

## 2017-11-13 DIAGNOSIS — I2510 Atherosclerotic heart disease of native coronary artery without angina pectoris: Secondary | ICD-10-CM | POA: Diagnosis not present

## 2017-11-13 DIAGNOSIS — Z7901 Long term (current) use of anticoagulants: Secondary | ICD-10-CM | POA: Diagnosis not present

## 2017-11-18 ENCOUNTER — Ambulatory Visit (INDEPENDENT_AMBULATORY_CARE_PROVIDER_SITE_OTHER): Payer: Medicare Other | Admitting: Physician Assistant

## 2017-11-18 ENCOUNTER — Encounter: Payer: Self-pay | Admitting: Physician Assistant

## 2017-11-18 VITALS — BP 166/88 | HR 65 | Wt 136.0 lb

## 2017-11-18 DIAGNOSIS — H04201 Unspecified epiphora, right lacrimal gland: Secondary | ICD-10-CM | POA: Diagnosis not present

## 2017-11-18 DIAGNOSIS — M810 Age-related osteoporosis without current pathological fracture: Secondary | ICD-10-CM | POA: Diagnosis not present

## 2017-11-18 DIAGNOSIS — I6523 Occlusion and stenosis of bilateral carotid arteries: Secondary | ICD-10-CM | POA: Diagnosis not present

## 2017-11-18 DIAGNOSIS — Z09 Encounter for follow-up examination after completed treatment for conditions other than malignant neoplasm: Secondary | ICD-10-CM | POA: Diagnosis not present

## 2017-11-18 DIAGNOSIS — I480 Paroxysmal atrial fibrillation: Secondary | ICD-10-CM | POA: Diagnosis not present

## 2017-11-18 DIAGNOSIS — G510 Bell's palsy: Secondary | ICD-10-CM | POA: Diagnosis not present

## 2017-11-18 DIAGNOSIS — Z23 Encounter for immunization: Secondary | ICD-10-CM | POA: Diagnosis not present

## 2017-11-18 DIAGNOSIS — H02103 Unspecified ectropion of right eye, unspecified eyelid: Secondary | ICD-10-CM | POA: Diagnosis not present

## 2017-11-18 MED ORDER — DENOSUMAB 60 MG/ML ~~LOC~~ SOSY: 60.0000 mg | PREFILLED_SYRINGE | Freq: Once | SUBCUTANEOUS | 0 refills | Status: DC

## 2017-11-18 MED ORDER — ATORVASTATIN CALCIUM 10 MG PO TABS: 10.0000 mg | ORAL_TABLET | Freq: Every day | ORAL | 3 refills | Status: DC

## 2017-11-18 MED ORDER — DENOSUMAB 60 MG/ML ~~LOC~~ SOSY
60.0000 mg | PREFILLED_SYRINGE | Freq: Once | SUBCUTANEOUS | Status: AC
  Administered 2017-11-18: 60 mg via SUBCUTANEOUS

## 2017-11-18 NOTE — Progress Notes (Signed)
HPI:                                                                Amanda Travis is a 84 y.o. female who presents to [facility] today for hospital discharge follow-up  History is provided by the patient and her caregiver Mateo Flow) and daughter, Marlowe Kays (via phone)  Pleasant 84 yo F with PMH Afib, chronic anticoagulation, cardiac pacemaker, IDA, and glaucoma presents for Bell's palsy follow-up. Patient was diagnosed with Bell's palsy on 11/13/17 at [facility] [place].  She underwent CTA head and neck which showed: - 1-51mm outpouching of distal right ICA felt to be infundibulum vs. aneursym  -calcification and mild narrowing of the proximal internal carotids bilaterally - moderate to severe narrowing of the right external carotid  She continues to endorse right-sided facial droop, largely unchanged. She is mainly bothered by excess tearing of her right eye. She is taping her eyelid at night and using erytromycin ointment. She is nearly finished with Acyclovir and Prednisone. Daughter is wanting to know if medication needs to be continued.  She is needing some dental procedures including an extraction and filling She usually takes antibiotics 1 hour prior to a dental procedure due to artificial knee replacement. She wants to make sure it is okay to proceed with these procedures. She has received permission from her cardiologist to hold her Eliquis.  Lastly, she is due for her Prolia injection.    Past Medical History:  Diagnosis Date  . Cardiac pacemaker in situ 03/08/2015   Placed for Tachy/Brady syndrome   . Cardiac risk counseling 03/08/2015   Await lipids ot calculate ASCVD risk score. No hx known MI/CVA   . Cough 03/08/2015  . Disorder of bone and cartilage 03/08/2015  . Gait instability 04/19/2015  . History of glaucoma 03/08/2015  . History of skin cancer 03/08/2015  . History of tachycardia-bradycardia syndrome 03/08/2015  .  Hyperlipidemia 03/08/2015  . Osteoporosis 03/08/2015   previously on Fosamax, pt told to stop this due to dental procedure. Will repeat DEXA.    Marland Kitchen Palpitations 03/08/2015   Pt concerned for Afib.CHA2DDS2-VASc Score 3.2%. Pt reports pacemaker check revealed AFib but I don't see Afib on PM reports, EKG sinus   . Postmenopausal 03/08/2015   Past Surgical History:  Procedure Laterality Date  . ABDOMINAL HYSTERECTOMY  1979  . APPENDECTOMY    . BLOOD CLOT  2006  . CHOLECYSTECTOMY    . COLON SURGERY  1998  . EYE SURGERY     CATARACT  . JOINT REPLACEMENT Bilateral    KNEE  . MOHS SURGERY  2015  . OVARIAN CANCER  1997  . TOOTH EXTRACTION     Social History   Tobacco Use  . Smoking status: Never Smoker  . Smokeless tobacco: Never Used  Substance Use Topics  . Alcohol use: No   family history includes Dementia in her brother; Heart disease in her brother and sister; Stroke in her mother.    ROS: negative except as noted in the HPI  Medications: Current Outpatient Medications  Medication Sig Dispense Refill  . apixaban (ELIQUIS) 2.5 MG TABS tablet Take 2.5 mg by mouth 2 (two) times daily.    . ARTIFICIAL TEARS 0.1-0.3 % SOLN  Apply to eye as needed.     . Calcium Carbonate-Vitamin D 600-400 MG-UNIT chew tablet Chew 2 tablets by mouth daily. 60 tablet 12  . Echinacea 125 MG CAPS Take by mouth. Take 200 mg by mouth daily    . Flaxseed, Linseed, (FLAXSEED OIL PO) Take by mouth.    . Garlic 4270 MG TABS Take by mouth. Take 1000 mg by mouth daily    . Glucosamine Sulfate 500 MG TABS Take by mouth. Take 200 mg by mouth daily    . latanoprost (XALATAN) 0.005 % ophthalmic solution 1 drop at bedtime.    Marland Kitchen loratadine (CLARITIN) 10 MG tablet Take 10 mg by mouth daily.    Marland Kitchen Lysine 500 MG CAPS Take by mouth. Take 1000 mg by mouth daily    . Melatonin 5 MG TABS Take by mouth. Take 5 mg by mouth daily    . Multiple Vitamin (MULTIVITAMIN) tablet Take 1 tablet by mouth daily.    Marland Kitchen oxybutynin  (DITROPAN) 5 MG tablet TAKE 1 TABLET(5 MG) BY MOUTH TWICE DAILY 180 tablet 1  . pantoprazole (PROTONIX) 40 MG tablet Take 40 mg by mouth daily.     . Policosanol 10 MG CAPS Take by mouth. Take by mouth 2 times daily    . Potassium 99 MG TABS Take by mouth.    . pyridOXINE (VITAMIN B-6) 100 MG tablet Take 100 mg by mouth daily.    . timolol (TIMOPTIC) 0.25 % ophthalmic solution 1 drop 2 (two) times daily.    . vitamin C (ASCORBIC ACID) 500 MG tablet Take 500 mg by mouth daily.     No current facility-administered medications for this visit.    Allergies  Allergen Reactions  . Cephalexin Diarrhea and Other (See Comments)  . Valacyclovir Hcl Other (See Comments)  . Tetracyclines & Related Diarrhea       Objective:  BP (!) 166/88   Pulse 65   Wt 136 lb (61.7 kg)   BMI 26.56 kg/m  Gen:  alert, not ill-appearing, no distress, appropriate for age 77: head normocephalic without obvious abnormality, conjunctiva and cornea clear, ectropion of right lower eyelid, epiphora present, trachea midline Pulm: Normal work of breathing, normal phonation, clear to auscultation bilaterally, no wheezes, rales or rhonchi CV: Normal rate, regular rhythm, s1 and s2 distinct, no murmurs, clicks or rubs  Neuro: alert and oriented x 3, severe cranial nerve VII palsy with facial asymmetry at rest, loss of right brow furrowing, incomplete eyelid closure, ectropion of right lower eyelid, and drooping of the right corner of the mouth MSK: extremities atraumatic, slowed gait, ambulating with a walker Skin: intact, no rashes on exposed skin, no jaundice, no cyanosis   Result Narrative  INDICATION: Focal neuro deficit, new, fixed or worsening, >6 hours. RIGHT RIGHT facial droop for greater than 6 hours.  STUDY: CTA COW and Carotids performed on 11/13/2017 12:19 PM.  COMPARISON: None.  TECHNIQUE: Scout and centrally focus precontrast axial images were first obtained, followed by a small test bolus of contrast  for CTA timing purposes. Thereafter, a full dose of iodinated contrast was administered intravenously by rapid injection, with  further multislice axial sections acquired in the arterial phase from the aortic arch to the cranial vertex. Image postprocessing was then performed on the independent workstation, creating multiplanar and 3-D reformatted images for comprehensive  analysis and diagnosis of the extra and intracranial circulation including the circle of Willis.  Contrast:90 mL IOPAMIDOL 76 % IV SOLN  FINDINGS: CTA  neck: #Aortic arch: No aneurysm. No significant stenosis or occlusion of the major arch vessels. Calcification is present. #Right carotid system: There is calcification at the bifurcation resulting in mild narrowing of the proximal internal carotid artery. There is moderate to severe narrowing of the external carotid artery origin. Rest of the vessel is unremarkable. #Left carotid system: Calcification present at the bifurcation resulting in mild narrowing of the proximal internal carotid artery. Rest of the vessel is without a significant stenosis, occlusion or aneurysm. #Vertebrobasilar system: The vertebral arteries arise from their respective subclavian arteries. No aneurysm, significant stenosis or occlusion.  #Additional comments: Cervical spondylosis appreciated.  CTA head: #Internal carotid arteries: Calcification is present. There is a 1 to 2 mm outpouching in the expected location of the posterior communicating artery on the RIGHT. This may represent an infundibulum versus a tiny aneurysm. The LEFT internal carotid  artery demonstrates no aneurysm, significant stenosis or occlusion. #Anterior circulation: No aneurysm, significant stenosis or occlusion. #Posterior circulation: No aneurysm, significant stenosis or occlusion. #Anatomic variants: None of significance. #Venous sinuses: Patent.  #Additional comments: None.  Other Result Information   Acute Interface, Incoming Rad Results - 11/13/2017  1:11 PM EDT INDICATION: Focal neuro deficit, new, fixed or worsening, >6 hours. RIGHT RIGHT facial droop for greater than 6 hours.  STUDY: CTA COW and Carotids performed on 11/13/2017 12:19 PM.  COMPARISON: None.  TECHNIQUE: Scout and centrally focus precontrast axial images were first obtained, followed by a small test bolus of contrast for CTA timing purposes. Thereafter, a full dose of iodinated contrast was administered intravenously by rapid injection, with  further multislice axial sections acquired in the arterial phase from the aortic arch to the cranial vertex. Image postprocessing was then performed on the independent workstation, creating multiplanar and 3-D reformatted images for comprehensive  analysis and diagnosis of the extra and intracranial circulation including the circle of Willis.  Contrast:  90 mL IOPAMIDOL 76 % IV SOLN  FINDINGS: CTA neck: #  Aortic arch: No aneurysm. No significant stenosis or occlusion of the major arch vessels. Calcification is present. #  Right carotid system: There is calcification at the bifurcation resulting in mild narrowing of the proximal internal carotid artery. There is moderate to severe narrowing of the external carotid artery origin. Rest of the vessel is unremarkable. #  Left carotid system: Calcification present at the bifurcation resulting in mild narrowing of the proximal internal carotid artery. Rest of the vessel is without a significant stenosis, occlusion or aneurysm. #  Vertebrobasilar system: The vertebral arteries arise from their respective subclavian arteries. No aneurysm, significant stenosis or occlusion.  #  Additional comments: Cervical spondylosis appreciated.  CTA head: #  Internal carotid arteries: Calcification is present. There is a 1 to 2 mm outpouching in the expected location of the posterior communicating artery on the RIGHT. This may represent an infundibulum  versus a tiny aneurysm. The LEFT internal carotid  artery demonstrates no aneurysm, significant stenosis or occlusion. #  Anterior circulation: No aneurysm, significant stenosis or occlusion. #  Posterior circulation: No aneurysm, significant stenosis or occlusion. #  Anatomic variants: None of significance. #  Venous sinuses: Patent.  #  Additional comments: None.   IMPRESSION: 1.  No large vessel intracranial occlusion.  2.  1 to 2 mm outpouching of the distal RIGHT ICA in the suspected location of the posterior communicating artery. This may represent an infundibulum versus an aneurysm.  3.  Atherosclerotic vascular disease at both carotid bifurcations resulting in  mild narrowing of the internal carotid arteries.  Degree of stenosis is determined using NASCET measurement technique: Severe: 70-99% Moderate: 50-69% Mild: Less than 50%  Electronically Signed by: Bryon Lions      No results found for this or any previous visit (from the past 72 hour(s)). No results found.    Assessment and Plan: 84 y.o. female with   .Fareeha was seen today for hospitalization follow-up.  Diagnoses and all orders for this visit:  Hospital discharge follow-up  Carotid stenosis, asymptomatic, bilateral -     atorvastatin (LIPITOR) 10 MG tablet; Take 1 tablet (10 mg total) by mouth at bedtime.  Right-sided Bell's palsy  Ectropion due to laxity of eyelid, right -     Ambulatory referral to Ophthalmology  Excessive tear production of right lacrimal gland  Age-related osteoporosis without current pathological fracture -     Cancel: DG Bone Density; Future -     Discontinue: denosumab (PROLIA) 60 MG/ML SOSY injection; Inject 60 mg into the skin once for 1 dose. -     denosumab (PROLIA) injection 60 mg  Needs flu shot -     Flu vaccine HIGH DOSE PF (Fluzone High dose)   Bell's palsy - right sided, severe dysfunction - finish antivirals and steroids - Continue taping eyelid closed  at night - Continue Erythromycin ointment at night - Follow-up with Oculofacial plastic surgeon for ectropion - okay to proceed with dental procedures - reassess with PCP in 4 weeks  Carotid stenosis - personally reviewed CTA and labs in [place] from 11/13/17 - mild-moderate, bilateral - medical management - aspirin contraindicated due to Eliquis - starting low-dose Atorvastatin daily - BP goal <140/90  Osteoporosis - last serum calcium level 9.5 11/13/17. prolia given today. Repeat in 6 months   Patient education and anticipatory guidance given Patient agrees with treatment plan Follow-up with PCP in 1 month as needed if symptoms worsen or fail to improve  I spent 40 minutes with this patient, greater than 50% was face-to-face time counseling regarding the above diagnoses   Darlyne Russian PA-C

## 2017-11-18 NOTE — Patient Instructions (Addendum)
For eye weakness/watering: Continue taping eyelid closed at night Continue Erythromycin ointment at night Follow-up with Oculofacial plastic surgeon (you should get a phone call within 3-5 business days to schedule an appt)   For carotid stenosis: - recommend low-dose statin medication to stabilize existing plaque and reduce risk of stroke - maintain blood pressure below 140/90 - follow a heart-healthy diet - you can discuss this more with Dr. Sheppard Coil at next office visit    Atherosclerosis Atherosclerosis is narrowing and hardening of the blood vessels (arteries). Arteries are tubes that carry blood that contains oxygen from the heart to all parts of the body. Arteries can become narrow or clogged with a buildup of fat, cholesterol, calcium, or other substances (plaque). Plaque decreases the amount of blood that can flow through the artery. Atherosclerosis can affect any artery in the body, including:  Heart arteries (coronary artery disease), which may cause heart attack.  Brain arteries, which may cause stroke.  Leg, arm, and pelvis arteries (peripheral artery disease), which may cause pain and numbness.  Kidney arteries, which may cause kidney (renal) failure.  Treatment may slow the disease and prevent further damage to the heart, brain, peripheral arteries, and kidneys. What are the causes? Atherosclerosis develops when plaque forms in an artery. This damages the inside wall of the artery. Over time, the plaque grows and hardens. It may break through the artery wall. This causes a blood clot to form over the break, which narrows the artery more. The clot may also break loose and travel to other arteries, causing more damage. What increases the risk? This condition is more likely to develop in people who:  Are middle age or older.  Have a family history of atherosclerosis.  Have high cholesterol.  Have high blood fats (triglycerides).  Have diabetes.  Are  overweight.  Smoke tobacco.  Do not exercise enough.  Have a substance in the blood that indicates increased levels of inflammation in the body (C-reactive protein, or CRP).  Have sleep apnea.  Are stressed.  Drink too much alcohol.  What are the signs or symptoms? This condition may not cause any symptoms. If you do have symptoms, they are caused by damage to an area of your body that is not getting enough blood. The following symptoms are possible:  Coronary artery disease may cause chest pain and shortness of breath.  Decreased blood supply to your brain may cause a stroke. Signs and symptoms of stroke may include sudden: ? Weakness on one side of the body. ? Confusion. ? Changes in vision. ? Inability to speak or understand speech. ? Loss of balance, coordination, or ability to walk. ? Severe headache. ? Loss of consciousness.  Peripheral artery disease may cause pain and numbness, often in the legs and hips.  Renal failure may cause fatigue, nausea, swelling, and itchy skin.  How is this diagnosed? This condition is diagnosed based on your medical history and a physical exam. During the exam, your health care provider will check your pulses and listen for a "whooshing" sound over your arteries (bruit). You may have tests, such as:  Blood tests to check your levels of cholesterol, triglycerides, and CRP.  Electrocardiogram (ECG) to check for heart damage.  Chest X-ray to see if your heart is enlarged, which is a sign of heart failure.  Stress test to see how your heart reacts to exercise.  Echocardiogram to get images of the inside of your heart.  Ankle-Brachial index to compare blood pressure in  your arms to blood pressure in your ankles.  Ultrasound of your peripheral arteries to check blood flow.  CT scan to check for damage to your heart or brain.  X-rays of blood vessels after dye has been injected (angiogram) to check blood flow.  How is this  treated? Treatment starts with lifestyle changes, which may include:  Changing your diet.  Losing weight.  Reducing stress.  Exercising and being more physically active.  Not smoking.  You also may need medicine to:  Lower triglycerides and cholesterol.  Lower and control blood pressure.  Prevent blood clots.  Lower inflammation in your body.  Lower and control your blood sugar.  Sometimes, surgery is needed to remove plaque, widen your artery, or create a new path for your blood (bypass). Surgical treatment may include:  Removing plaque from an artery (endarterectomy).  Opening a narrowed heart artery (angioplasty).  Heart or peripheral artery bypass graft surgery.  Follow these instructions at home: Lifestyle   Eat a heart-healthy diet. Talk to your health care provider or a diet specialist (dietitian) if you need help. A heart-healthy diet includes: ? Limiting unhealthy fats and increasing healthy fats. Some examples of healthy fats are olive oil and canola oil. ? Eating plant-based foods, such as fruits, vegetables, nuts, legumes, and whole grains.  Follow an exercise program as told by your health care provider.  Maintain a healthy weight. Lose weight if directed by your health care provider.  Rest when you are tired.  Learn to manage your stress.  Do not use any tobacco products, such as cigarettes, chewing tobacco, and e-cigarettes. If you need help quitting, ask your health care provider.  Limit alcohol intake to no more than 1 drink a day for nonpregnant women and 2 drinks a day for men. One drink equals 12 oz of beer, 5 oz of wine, or 1 oz of hard liquor.  Do not abuse drugs. General instructions  Take over-the-counter and prescription medicines only as told by your health care provider.  Manage other health conditions as told by your health care provider.  Keep all follow-up visits as told by your health care provider. This is  important. Contact a health care provider if:  You have chest pain or discomfort. This includes squeezing chest pain that may feel like indigestion (angina).  You have shortness of breath.  You have an irregular heartbeat.  You have unexplained fatigue.  You have unexplained pain or numbness in an arm, leg, or hip.  You have nausea, swelling of your hands or feet, and itchy skin. Get help right away if:  You have symptoms of a heart attack, such as: ? Chest pain. ? Shortness of breath. ? Pain in your neck, jaw, arms, back, or stomach. ? Cold sweat. ? Nausea. ? Light-headedness.  You have symptoms of a stroke, such as sudden: ? Weakness on one side of your body. ? Confusion. ? Changes in vision. ? Inability to speak or understand speech. ? Loss of balance, coordination, or ability to walk. ? Severe headache. ? Loss of consciousness. These symptoms may represent a serious problem that is an emergency. Do not wait to see if the symptoms will go away. Get medical help right away. Call your local emergency services (911 in the [place] Do not drive yourself to the hospital. This information is not intended to replace advice given to you by your health care provider. Make sure you discuss any questions you have with your health care provider. Document  Released: 04/27/2003 Document Revised: 07/13/2015 Document Reviewed: 12/26/2014 Elsevier Interactive Patient Education  [employer].

## 2017-11-20 ENCOUNTER — Telehealth: Payer: Self-pay

## 2017-11-20 DIAGNOSIS — Z0100 Encounter for examination of eyes and vision without abnormal findings: Secondary | ICD-10-CM

## 2017-11-20 NOTE — Telephone Encounter (Signed)
Pt's son called requesting a referral for opthalmologist for pt. Did not specify a particular location.Thanks.

## 2017-11-20 NOTE — Telephone Encounter (Signed)
To PCP

## 2017-11-21 NOTE — Telephone Encounter (Signed)
Patient states the cholesterol medication is causing her to have diarrhea. Please advise.

## 2017-11-21 NOTE — Telephone Encounter (Signed)
Referral sent 

## 2017-11-21 NOTE — Telephone Encounter (Signed)
If she is concerned about diarrhea as a possible side effect, she can go ahead and stop the cholesterol medicine but if her symptoms persist, she should come in for a visit for further evaluation.

## 2017-11-24 NOTE — Telephone Encounter (Signed)
Pt stopped the cholesterol Rx last week, and the diarrhea has gotten better. She is open to a different Rx, as long as it does not have a potential side effect of diarrhea.

## 2017-11-26 MED ORDER — PRAVASTATIN SODIUM 10 MG PO TABS: 10.0000 mg | ORAL_TABLET | Freq: Every day | ORAL | 1 refills | Status: DC

## 2017-11-26 NOTE — Addendum Note (Signed)
Addended by: Maryla Morrow on: 11/26/2017 10:28 AM   Modules accepted: Orders

## 2017-11-26 NOTE — Telephone Encounter (Signed)
Pt advised.

## 2017-11-26 NOTE — Telephone Encounter (Signed)
I sent in a low-dose of pravastatin.  If she is having problems on this medicine, she may just be intolerant to statin medications.

## 2017-11-27 DIAGNOSIS — G510 Bell's palsy: Secondary | ICD-10-CM | POA: Diagnosis not present

## 2017-11-27 DIAGNOSIS — H401132 Primary open-angle glaucoma, bilateral, moderate stage: Secondary | ICD-10-CM | POA: Diagnosis not present

## 2017-12-02 ENCOUNTER — Ambulatory Visit: Payer: Medicare Other

## 2017-12-18 ENCOUNTER — Ambulatory Visit (INDEPENDENT_AMBULATORY_CARE_PROVIDER_SITE_OTHER): Payer: Medicare Other | Admitting: Osteopathic Medicine

## 2017-12-18 ENCOUNTER — Encounter: Payer: Self-pay | Admitting: Osteopathic Medicine

## 2017-12-18 VITALS — BP 133/72 | HR 60 | Temp 98.1°F | Wt 141.0 lb

## 2017-12-18 DIAGNOSIS — I6523 Occlusion and stenosis of bilateral carotid arteries: Secondary | ICD-10-CM | POA: Diagnosis not present

## 2017-12-18 DIAGNOSIS — R030 Elevated blood-pressure reading, without diagnosis of hypertension: Secondary | ICD-10-CM | POA: Diagnosis not present

## 2017-12-18 DIAGNOSIS — G510 Bell's palsy: Secondary | ICD-10-CM

## 2017-12-18 NOTE — Progress Notes (Signed)
HPI: Amanda Travis is a 84 y.o. female who  has a past medical history of Cardiac pacemaker in situ (03/08/2015), Cardiac risk counseling (03/08/2015), Cough (03/08/2015), Disorder of bone and cartilage (03/08/2015), Gait instability (04/19/2015), History of glaucoma (03/08/2015), History of skin cancer (03/08/2015), History of tachycardia-bradycardia syndrome (03/08/2015), Hyperlipidemia (03/08/2015), Osteoporosis (03/08/2015), Palpitations (03/08/2015), and Postmenopausal (03/08/2015).  she presents to [facility] today, 12/18/17,  for chief complaint of:  Bell's Palsy Carotid Stenosis follow-up  Diagnosed with Bell's palsy in the emergency department, 11/13/2017.  Followed up with a colleague here in this office, 11/18/2017.  At that time, largely unchanged right-sided facial droop.  Excess tearing of the right eye was bothersome.  Today, she is improved about back to baseline, still some tearing in the right eye, following up with ophthalmology.  She underwent CTA head and neck which showed: - 1-51mm outpouching of distal right ICA felt to be infundibulum vs. aneursym  -calcification and mild narrowing of the proximal internal carotids bilaterally - moderate to severe narrowing of the right external carotid   For carotid stenosis, low dose atorvastatin but was having some issues with this w/ diarrhea.  Was switched to pravastatin.  She is doing really well on this medicine, no side effects.  Hypertension: Home blood pressures are looking okay, have not verified home monitor in the office in some time.  Repeat blood pressure today in office is good.  No chest pain, pressure, shortness of breath, palpitations.      Past medical history, surgical history, and family history reviewed.  Current medication list and allergy/intolerance information reviewed.   (See remainder of HPI, ROS, Phys Exam below)     ASSESSMENT/PLAN:   Right-sided Bell's palsy  Carotid  stenosis, asymptomatic, bilateral  Elevated blood pressure reading   Bell's palsy is improved, patient is otherwise doing well, no concerns today.    Follow-up plan: Return for medicare wellness due around 05/2018 - ok to see Maudie Mercury .                      ############################################ ############################################ ############################################ ############################################    Outpatient Encounter Medications as of 12/18/2017  Medication Sig  . acyclovir (ZOVIRAX) 400 MG tablet TK 1 T PO FID  . apixaban (ELIQUIS) 2.5 MG TABS tablet Take 2.5 mg by mouth 2 (two) times daily.  . ARTIFICIAL TEARS 0.1-0.3 % SOLN Apply to eye as needed.   . Calcium Carbonate-Vitamin D 600-400 MG-UNIT chew tablet Chew 2 tablets by mouth daily.  . dorzolamide-timolol (COSOPT) 22.3-6.8 MG/ML ophthalmic solution INT 1 GTT IN OU BID  . Echinacea 125 MG CAPS Take by mouth. Take 200 mg by mouth daily  . Flaxseed, Linseed, (FLAXSEED OIL PO) Take by mouth.  . Garlic 7829 MG TABS Take by mouth. Take 1000 mg by mouth daily  . Glucosamine Sulfate 500 MG TABS Take by mouth. Take 200 mg by mouth daily  . latanoprost (XALATAN) 0.005 % ophthalmic solution 1 drop at bedtime.  Marland Kitchen loratadine (CLARITIN) 10 MG tablet Take 10 mg by mouth daily.  Marland Kitchen Lysine 500 MG CAPS Take by mouth. Take 1000 mg by mouth daily  . Melatonin 5 MG TABS Take by mouth. Take 5 mg by mouth daily  . Multiple Vitamin (MULTIVITAMIN) tablet Take 1 tablet by mouth daily.  Marland Kitchen oxybutynin (DITROPAN) 5 MG tablet TAKE 1 TABLET(5 MG) BY MOUTH TWICE DAILY  . pantoprazole (PROTONIX) 40 MG tablet Take 40 mg by mouth daily.   Marland Kitchen  Policosanol 10 MG CAPS Take by mouth. Take by mouth 2 times daily  . Potassium 99 MG TABS Take by mouth.  . pravastatin (PRAVACHOL) 10 MG tablet Take 1 tablet (10 mg total) by mouth daily.  . predniSONE (DELTASONE) 10 MG tablet TK 1 T PO BID FOR 5 DAYS  . pyridOXINE  (VITAMIN B-6) 100 MG tablet Take 100 mg by mouth daily.  . timolol (TIMOPTIC) 0.25 % ophthalmic solution 1 drop 2 (two) times daily.  . vitamin C (ASCORBIC ACID) 500 MG tablet Take 500 mg by mouth daily.   No facility-administered encounter medications on file as of 12/18/2017.    Allergies  Allergen Reactions  . Cephalexin Diarrhea and Other (See Comments)  . Valacyclovir Hcl Other (See Comments)  . Tetracyclines & Related Diarrhea      Review of Systems:  Constitutional: No recent illness with exception of Bell's palsy, no fever/chills  HEENT: No  headache, no vision change  Cardiac: No  chest pain, No  pressure, No palpitations  Respiratory:  No  shortness of breath. No  Cough  Skin: No  Rash  Hem/Onc: No  easy bruising/bleeding, No  abnormal lumps/bumps  Neurologic: No  weakness, No  Dizziness  Psychiatric: No  concerns with depression, No  concerns with anxiety  Exam:  BP 133/72 (BP Location: Right Arm, Patient Position: Sitting, Cuff Size: Normal)   Pulse 60   Temp 98.1 F (36.7 C) (Oral)   Wt 141 lb (64 kg)   BMI 27.54 kg/m   Constitutional: VS see above. General Appearance: alert, well-developed, well-nourished, NAD  Eyes: Normal lids and conjunctive, non-icteric sclera  Ears, Nose, Mouth, Throat: MMM, Normal external inspection ears/nares/mouth/lips/gums.  Neck: No masses, trachea midline.   Respiratory: Normal respiratory effort. no wheeze, no rhonchi, no rales  Cardiovascular: S1/S2 normal, no murmur, no rub/gallop auscultated. RRR.   Musculoskeletal: Gait normal. Symmetric and independent movement of all extremities  Neurological: Normal balance/coordination. No tremor.  Skin: warm, dry, intact.   Psychiatric: Normal judgment/insight. Normal mood and affect. Oriented x3.   Visit summary with medication list and pertinent instructions was printed for patient to review, advised to alert [place] if any changes needed. All questions at time of visit  were answered - patient instructed to contact office with any additional concerns. ER/RTC precautions were reviewed with the patient and understanding verbalized.   Follow-up plan: Return for medicare wellness due around 05/2018 - ok to see Maudie Mercury .     Please note: voice recognition software was used to produce this document, and typos may escape review. Please contact Dr. Sheppard Coil for any needed clarifications.

## 2018-02-24 DIAGNOSIS — I480 Paroxysmal atrial fibrillation: Secondary | ICD-10-CM | POA: Diagnosis not present

## 2018-02-24 DIAGNOSIS — Z45010 Encounter for checking and testing of cardiac pacemaker pulse generator [battery]: Secondary | ICD-10-CM | POA: Diagnosis not present

## 2018-02-24 DIAGNOSIS — Z45018 Encounter for adjustment and management of other part of cardiac pacemaker: Secondary | ICD-10-CM | POA: Diagnosis not present

## 2018-03-20 ENCOUNTER — Ambulatory Visit (INDEPENDENT_AMBULATORY_CARE_PROVIDER_SITE_OTHER): Payer: Medicare Other

## 2018-03-20 ENCOUNTER — Ambulatory Visit (INDEPENDENT_AMBULATORY_CARE_PROVIDER_SITE_OTHER): Payer: Medicare Other | Admitting: Osteopathic Medicine

## 2018-03-20 ENCOUNTER — Encounter: Payer: Self-pay | Admitting: Osteopathic Medicine

## 2018-03-20 VITALS — BP 153/77 | HR 76 | Temp 98.0°F | Wt 139.2 lb

## 2018-03-20 DIAGNOSIS — J449 Chronic obstructive pulmonary disease, unspecified: Secondary | ICD-10-CM

## 2018-03-20 DIAGNOSIS — I517 Cardiomegaly: Secondary | ICD-10-CM

## 2018-03-20 DIAGNOSIS — R600 Localized edema: Secondary | ICD-10-CM

## 2018-03-20 DIAGNOSIS — R0609 Other forms of dyspnea: Secondary | ICD-10-CM

## 2018-03-20 DIAGNOSIS — R0602 Shortness of breath: Secondary | ICD-10-CM | POA: Diagnosis not present

## 2018-03-20 NOTE — Patient Instructions (Signed)
Plan:  Labs today and Xray of the chest  Plan for lung function test next week  Depending on results, may need to consider ultrasound of the heart and possibly the legs to determine heart function and circulation  Will get PT to work on balance and stamina

## 2018-03-20 NOTE — Progress Notes (Signed)
HPI: Amanda Travis is a 85 y.o. female who  has a past medical history of Cardiac pacemaker in situ (03/08/2015), Cardiac risk counseling (03/08/2015), Cough (03/08/2015), Disorder of bone and cartilage (03/08/2015), Gait instability (04/19/2015), History of glaucoma (03/08/2015), History of skin cancer (03/08/2015), History of tachycardia-bradycardia syndrome (03/08/2015), Hyperlipidemia (03/08/2015), Osteoporosis (03/08/2015), Palpitations (03/08/2015), and Postmenopausal (03/08/2015).  she presents to [facility] today, 03/20/18,  for chief complaint of:  Shortness of breath  . Context: never smoker, (+)secondahnd exposure, no hx asthma/COPD . Quality: dyspnea on exertion, has to stop maybe every 30 feet or so to catch breath . Duration: probably a few months but daughter thinks might be getting worse . Modifying factors: rest helps . Assoc signs/symptoms: no claudication, no dizziness, no LOC, no CP. Reports some LE edema, mild, resolved w/ leg elevation  Patient is accompanied by daughter who assists with history-taking.    At today's visit 03/20/18 ... PMH, PSH, FH reviewed and updated as needed.  Current medication list and allergy/intolerance hx reviewed and updated as needed. (See remainder of HPI, ROS, Phys Exam below)         ASSESSMENT/PLAN: The primary encounter diagnosis was Dyspnea on exertion. A diagnosis of Lower extremity edema was also pertinent to this visit.  Patient is not overly concerned about the symptoms, no red flag signs.  Daughter is quite concerned and is also asking for physical therapy referral, worried about mom falling.  I think we can go ahead and evaluate for possible COPD due to secondhand smoke exposure, possible heart failure.  Patient recently had pacemaker checked and this was fine, has upcoming follow-up with cardiology next month.  Declined EKG today.  Bibasilar rales, question atelectasis versus pulmonary edema, trace  lower extremity edema and some skin changes consistent with possible venous stasis versus PAD. Consider ABI/Echo.   Orders Placed This Encounter  Procedures  . DG Chest 2 View  . CBC  . COMPLETE METABOLIC PANEL WITH GFR  . TSH  . B Nat Peptide  . Ambulatory referral to [place]       Patient Instructions  Plan:  Labs today and Xray of the chest  Plan for lung function test next week  Depending on results, may need to consider ultrasound of the heart and possibly the legs to determine heart function and circulation  Will get PT to work on balance and stamina     Follow-up plan: Return in about 1 week (around 03/27/2018) for SPIROMETRY / LUNG FUNCTION TEST & 6-MINUTE WALK TEST .                                                 ################################################# ################################################# ################################################# #################################################    Current Meds  Medication Sig  . apixaban (ELIQUIS) 2.5 MG TABS tablet Take 2.5 mg by mouth 2 (two) times daily.  . ARTIFICIAL TEARS 0.1-0.3 % SOLN Apply to eye as needed.   . Calcium Carbonate-Vitamin D 600-400 MG-UNIT chew tablet Chew 2 tablets by mouth daily.  . dorzolamide-timolol (COSOPT) 22.3-6.8 MG/ML ophthalmic solution INT 1 GTT IN OU BID  . Echinacea 125 MG CAPS Take by mouth. Take 200 mg by mouth daily  . Flaxseed, Linseed, (FLAXSEED OIL PO) Take by mouth.  . Garlic 0109 MG TABS Take by mouth. Take 1000 mg by mouth daily  . Glucosamine  Sulfate 500 MG TABS Take by mouth. Take 200 mg by mouth daily  . latanoprost (XALATAN) 0.005 % ophthalmic solution 1 drop at bedtime.  Marland Kitchen loratadine (CLARITIN) 10 MG tablet Take 10 mg by mouth daily.  Marland Kitchen Lysine 500 MG CAPS Take by mouth. Take 1000 mg by mouth daily  . Melatonin 5 MG TABS Take by mouth. Take 5 mg by mouth daily  . Multiple Vitamin (MULTIVITAMIN)  tablet Take 1 tablet by mouth daily.  Marland Kitchen oxybutynin (DITROPAN) 5 MG tablet TAKE 1 TABLET(5 MG) BY MOUTH TWICE DAILY  . pantoprazole (PROTONIX) 40 MG tablet Take 40 mg by mouth daily.   . Policosanol 10 MG CAPS Take by mouth. Take by mouth 2 times daily  . Potassium 99 MG TABS Take by mouth.  . pravastatin (PRAVACHOL) 10 MG tablet Take 1 tablet (10 mg total) by mouth daily.  Marland Kitchen PROLIA 60 MG/ML SOSY injection INJ 60 MG INTO THE SKIN ONCE FOR 1 DOSE  . pyridOXINE (VITAMIN B-6) 100 MG tablet Take 100 mg by mouth daily.  . timolol (TIMOPTIC) 0.25 % ophthalmic solution 1 drop 2 (two) times daily.  . vitamin C (ASCORBIC ACID) 500 MG tablet Take 500 mg by mouth daily.    Allergies  Allergen Reactions  . Cephalexin Diarrhea and Other (See Comments)  . Valacyclovir Hcl Other (See Comments)  . Atorvastatin Diarrhea  . Tetracyclines & Related Diarrhea       Review of Systems:  Constitutional: No recent illness  HEENT: No  headache, no vision change  Cardiac: No  chest pain, No  pressure, No palpitations  Respiratory:  +shortness of breath. No  Cough  Gastrointestinal: No  abdominal pain, no change on bowel habits  Musculoskeletal: No new myalgia/arthralgia  Skin: No  Rash  Hem/Onc: No  easy bruising/bleeding, No  abnormal lumps/bumps  Neurologic: No  weakness, No  Dizziness  Psychiatric: No  concerns with depression, No  concerns with anxiety  Exam:  BP (!) 153/77 (BP Location: Left Arm, Patient Position: Sitting, Cuff Size: Normal)   Pulse 76   Temp 98 F (36.7 C) (Oral)   Wt 139 lb 3.2 oz (63.1 kg)   SpO2 97%   BMI 27.19 kg/m   Constitutional: VS see above. General Appearance: alert, well-developed, well-nourished, NAD  Eyes: Normal lids and conjunctive, non-icteric sclera  Ears, Nose, Mouth, Throat: MMM, Normal external inspection ears/nares/mouth/lips/gums.  Neck: No masses, trachea midline.   Respiratory: Normal respiratory effort. no wheeze, no rhonchi,  +bibasilar rales  Cardiovascular: S1/S2 normal, no murmur, no rub/gallop auscultated. RRR w/ few early beats, doesn't really sound like fibrillation. +1 LE edema bilaterally, no JVD  Musculoskeletal: Gait normal. Symmetric and independent movement of all extremities  Neurological: Normal balance/coordination. No tremor.  Skin: warm, dry, intact.   Psychiatric: Normal judgment/insight. Normal mood and affect. Oriented x3.       Visit summary with medication list and pertinent instructions was printed for patient to review, patient was advised to alert [place] if any updates are needed. All questions at time of visit were answered - patient instructed to contact office with any additional concerns. ER/RTC precautions were reviewed with the patient and understanding verbalized.    Please note: voice recognition software was used to produce this document, and typos may escape review. Please contact Dr. Sheppard Coil for any needed clarifications.    Follow up plan: Return in about 1 week (around 03/27/2018) for SPIROMETRY / LUNG FUNCTION TEST & 6-MINUTE WALK TEST .

## 2018-03-21 LAB — COMPLETE METABOLIC PANEL WITH GFR
AG Ratio: 1.5 (calc) (ref 1.0–2.5)
ALKALINE PHOSPHATASE (APISO): 57 U/L (ref 33–130)
ALT: 11 U/L (ref 6–29)
AST: 16 U/L (ref 10–35)
Albumin: 4.2 g/dL (ref 3.6–5.1)
BUN/Creatinine Ratio: 18 (calc) (ref 6–22)
BUN: 17 mg/dL (ref 7–25)
CO2: 25 mmol/L (ref 20–32)
Calcium: 9.6 mg/dL (ref 8.6–10.4)
Chloride: 103 mmol/L (ref 98–110)
Creat: 0.92 mg/dL — ABNORMAL HIGH (ref 0.60–0.88)
GFR, Est African American: 65 mL/min/{1.73_m2} (ref >=60)
GFR, Est Non African American: 56 mL/min/{1.73_m2} — ABNORMAL LOW (ref >=60)
Globulin: 2.8 g/dL (calc) (ref 1.9–3.7)
Glucose, Bld: 118 mg/dL — ABNORMAL HIGH (ref 65–99)
Potassium: 4.1 mmol/L (ref 3.5–5.3)
Sodium: 139 mmol/L (ref 135–146)
Total Bilirubin: 0.8 mg/dL (ref 0.2–1.2)
Total Protein: 7 g/dL (ref 6.1–8.1)

## 2018-03-21 LAB — CBC
HCT: 44.5 % (ref 35.0–45.0)
Hemoglobin: 15.1 g/dL (ref 11.7–15.5)
MCH: 31.1 pg (ref 27.0–33.0)
MCHC: 33.9 g/dL (ref 32.0–36.0)
MCV: 91.8 fL (ref 80.0–100.0)
MPV: 9.5 fL (ref 7.5–12.5)
Platelets: 216 10*3/uL (ref 140–400)
RBC: 4.85 10*6/uL (ref 3.80–5.10)
RDW: 12.6 % (ref 11.0–15.0)
WBC: 6.2 10*3/uL (ref 3.8–10.8)

## 2018-03-21 LAB — TSH: TSH: 3.24 mIU/L (ref 0.40–4.50)

## 2018-03-21 LAB — BRAIN NATRIURETIC PEPTIDE: Brain Natriuretic Peptide: 180 pg/mL — ABNORMAL HIGH (ref <100)

## 2018-03-23 DIAGNOSIS — I4891 Unspecified atrial fibrillation: Secondary | ICD-10-CM | POA: Diagnosis not present

## 2018-03-23 DIAGNOSIS — Z950 Presence of cardiac pacemaker: Secondary | ICD-10-CM | POA: Diagnosis not present

## 2018-03-23 DIAGNOSIS — D509 Iron deficiency anemia, unspecified: Secondary | ICD-10-CM | POA: Diagnosis not present

## 2018-03-23 DIAGNOSIS — Z79899 Other long term (current) drug therapy: Secondary | ICD-10-CM | POA: Diagnosis not present

## 2018-03-23 DIAGNOSIS — Z7901 Long term (current) use of anticoagulants: Secondary | ICD-10-CM | POA: Diagnosis not present

## 2018-03-23 DIAGNOSIS — Z85828 Personal history of other malignant neoplasm of skin: Secondary | ICD-10-CM | POA: Diagnosis not present

## 2018-03-23 DIAGNOSIS — Z7722 Contact with and (suspected) exposure to environmental tobacco smoke (acute) (chronic): Secondary | ICD-10-CM | POA: Diagnosis not present

## 2018-03-23 DIAGNOSIS — H02103 Unspecified ectropion of right eye, unspecified eyelid: Secondary | ICD-10-CM | POA: Diagnosis not present

## 2018-03-23 DIAGNOSIS — Z8543 Personal history of malignant neoplasm of ovary: Secondary | ICD-10-CM | POA: Diagnosis not present

## 2018-03-23 DIAGNOSIS — G510 Bell's palsy: Secondary | ICD-10-CM | POA: Diagnosis not present

## 2018-03-23 DIAGNOSIS — Z8679 Personal history of other diseases of the circulatory system: Secondary | ICD-10-CM | POA: Diagnosis not present

## 2018-03-23 DIAGNOSIS — R600 Localized edema: Secondary | ICD-10-CM | POA: Diagnosis not present

## 2018-03-23 DIAGNOSIS — I6523 Occlusion and stenosis of bilateral carotid arteries: Secondary | ICD-10-CM | POA: Diagnosis not present

## 2018-03-23 DIAGNOSIS — M8580 Other specified disorders of bone density and structure, unspecified site: Secondary | ICD-10-CM | POA: Diagnosis not present

## 2018-03-23 DIAGNOSIS — K259 Gastric ulcer, unspecified as acute or chronic, without hemorrhage or perforation: Secondary | ICD-10-CM | POA: Diagnosis not present

## 2018-03-23 DIAGNOSIS — R0609 Other forms of dyspnea: Secondary | ICD-10-CM | POA: Diagnosis not present

## 2018-03-25 ENCOUNTER — Telehealth: Payer: Self-pay

## 2018-03-25 NOTE — Telephone Encounter (Signed)
Nynicka from [facility] called requesting verbal orders for PT/Social worker for pt. Physical therapy will be for 2 times a wk for 4 wks. Social worker will be completing home assessment and pt's needs. Verbal ok given for requested orders.

## 2018-03-26 ENCOUNTER — Ambulatory Visit: Payer: Medicare Other | Admitting: Osteopathic Medicine

## 2018-03-26 VITALS — BP 135/75 | HR 65 | Resp 17 | Ht 61.0 in | Wt 137.0 lb

## 2018-03-26 DIAGNOSIS — H02103 Unspecified ectropion of right eye, unspecified eyelid: Secondary | ICD-10-CM | POA: Diagnosis not present

## 2018-03-26 DIAGNOSIS — I4891 Unspecified atrial fibrillation: Secondary | ICD-10-CM | POA: Diagnosis not present

## 2018-03-26 DIAGNOSIS — R0609 Other forms of dyspnea: Principal | ICD-10-CM

## 2018-03-26 DIAGNOSIS — G510 Bell's palsy: Secondary | ICD-10-CM | POA: Diagnosis not present

## 2018-03-26 DIAGNOSIS — R600 Localized edema: Secondary | ICD-10-CM | POA: Diagnosis not present

## 2018-03-26 DIAGNOSIS — I6523 Occlusion and stenosis of bilateral carotid arteries: Secondary | ICD-10-CM | POA: Diagnosis not present

## 2018-03-26 MED ORDER — ALBUTEROL SULFATE (2.5 MG/3ML) 0.083% IN NEBU
2.5000 mg | INHALATION_SOLUTION | Freq: Once | RESPIRATORY_TRACT | Status: AC
  Administered 2018-03-26: 2.5 mg via RESPIRATORY_TRACT

## 2018-03-26 NOTE — Progress Notes (Signed)
HPI: Amanda Travis is a 85 y.o. female who  has a past medical history of Cardiac pacemaker in situ (03/08/2015), Cardiac risk counseling (03/08/2015), Cough (03/08/2015), Disorder of bone and cartilage (03/08/2015), Gait instability (04/19/2015), History of glaucoma (03/08/2015), History of skin cancer (03/08/2015), History of tachycardia-bradycardia syndrome (03/08/2015), Hyperlipidemia (03/08/2015), Osteoporosis (03/08/2015), Palpitations (03/08/2015), and Postmenopausal (03/08/2015).  she presents to [facility] today, 03/26/18,  for chief complaint of:  Dyspnea on exertion - spirometry today  Spirometry inconclusive. 6-minute walk SaO2 90 or above.  Daughter and patient have been walking more often, patient c/o arm pain more so than SOB these days.  PT has been to the house and started work on gait training.   Patient is accompanied by daughter who assists with history-taking.    At today's visit 03/26/18 ... PMH, PSH, FH reviewed and updated as needed.  Current medication list and allergy/intolerance hx reviewed and updated as needed. (See remainder of HPI, ROS, Phys Exam below)           ASSESSMENT/PLAN: The encounter diagnosis was Dyspnea on exertion.  Inconclusive spirometry. Doesn't qualify for O2. Pt would rather not use inhalers. I think ok to work on conditioning and endurance and go from there if DOE symptoms arise.   Orders Placed This Encounter  Procedures  . PR EVAL OF BRONCHOSPASM     Meds ordered this encounter  Medications  . albuterol (PROVENTIL) (2.5 MG/3ML) 0.083% nebulizer solution 2.5 mg      Follow-up plan: Return for MEDICARE WELLNESS VISIT W/ KIM 05/2018, CHECK-UP WITH DR Sheppard Coil AFTER THAT  .                                                 ################################################# ################################################# ################################################# #################################################    Current Meds  Medication Sig  . apixaban (ELIQUIS) 2.5 MG TABS tablet Take 2.5 mg by mouth 2 (two) times daily.  . ARTIFICIAL TEARS 0.1-0.3 % SOLN Apply to eye as needed.   . Calcium Carbonate-Vitamin D 600-400 MG-UNIT chew tablet Chew 2 tablets by mouth daily.  . dorzolamide-timolol (COSOPT) 22.3-6.8 MG/ML ophthalmic solution INT 1 GTT IN OU BID  . Echinacea 125 MG CAPS Take by mouth. Take 200 mg by mouth daily  . Flaxseed, Linseed, (FLAXSEED OIL PO) Take by mouth.  . Garlic 8756 MG TABS Take by mouth. Take 1000 mg by mouth daily  . Glucosamine Sulfate 500 MG TABS Take by mouth. Take 200 mg by mouth daily  . latanoprost (XALATAN) 0.005 % ophthalmic solution 1 drop at bedtime.  Marland Kitchen loratadine (CLARITIN) 10 MG tablet Take 10 mg by mouth daily.  Marland Kitchen Lysine 500 MG CAPS Take by mouth. Take 1000 mg by mouth daily  . Melatonin 5 MG TABS Take by mouth. Take 5 mg by mouth daily  . Multiple Vitamin (MULTIVITAMIN) tablet Take 1 tablet by mouth daily.  Marland Kitchen oxybutynin (DITROPAN) 5 MG tablet TAKE 1 TABLET(5 MG) BY MOUTH TWICE DAILY  . pantoprazole (PROTONIX) 40 MG tablet Take 40 mg by mouth daily.   . Policosanol 10 MG CAPS Take by mouth. Take by mouth 2 times daily  . Potassium 99 MG TABS Take by mouth.  . pravastatin (PRAVACHOL) 10 MG tablet Take 1 tablet (10 mg total) by mouth daily.  Marland Kitchen PROLIA 60 MG/ML SOSY injection INJ 60 MG INTO THE  SKIN ONCE FOR 1 DOSE  . pyridOXINE (VITAMIN B-6) 100 MG tablet Take 100 mg by mouth daily.  . vitamin C (ASCORBIC ACID) 500 MG tablet Take 500 mg by mouth daily.    Allergies  Allergen Reactions  . Cephalexin Diarrhea and Other (See Comments)  . Valacyclovir Hcl Other  (See Comments)  . Atorvastatin Diarrhea  . Tetracyclines & Related Diarrhea       Review of Systems:  Constitutional: No recent illness  Cardiac: No  chest pain, No  pressure, No palpitations  Respiratory:  No  shortness of breath. No  Cough  Neurologic: +generalized weakness, No  Dizziness  Exam:  BP (!) 161/67   Pulse 65   Resp 17   Ht 5\' 1"  (1.549 m)   Wt 137 lb (62.1 kg)   SpO2 90% Comment: o2 90%-96% dropped to 86% for a few seconds  BMI 25.89 kg/m   Constitutional: VS see above. General Appearance: alert, well-developed, well-nourished, NAD  Eyes: Normal lids and conjunctive, non-icteric sclera  Ears, Nose, Mouth, Throat: MMM, Normal external inspection ears/nares/mouth/lips/gums.  Neck: No masses, trachea midline.   Respiratory: Normal respiratory effort. no wheeze, no rhonchi, no rales  Cardiovascular: S1/S2 normal, no murmur, no rub/gallop auscultated. RRR.   Musculoskeletal: Gait hesitant but steady. Ambulates with walker.   Neurological: Normal balance/coordination. No tremor.  Skin: warm, dry, intact.   Psychiatric: Normal judgment/insight. Normal mood and affect. Oriented x3.       Visit summary with medication list and pertinent instructions was printed for patient to review, patient was advised to alert [place] if any updates are needed. All questions at time of visit were answered - patient instructed to contact office with any additional concerns. ER/RTC precautions were reviewed with the patient and understanding verbalized.    Please note: voice recognition software was used to produce this document, and typos may escape review. Please contact Dr. Sheppard Coil for any needed clarifications.    Follow up plan: Return for MEDICARE WELLNESS VISIT W/ KIM 05/2018, CHECK-UP WITH DR Sheppard Coil AFTER THAT .

## 2018-03-30 DIAGNOSIS — H02103 Unspecified ectropion of right eye, unspecified eyelid: Secondary | ICD-10-CM | POA: Diagnosis not present

## 2018-03-30 DIAGNOSIS — R600 Localized edema: Secondary | ICD-10-CM | POA: Diagnosis not present

## 2018-03-30 DIAGNOSIS — I6523 Occlusion and stenosis of bilateral carotid arteries: Secondary | ICD-10-CM | POA: Diagnosis not present

## 2018-03-30 DIAGNOSIS — G510 Bell's palsy: Secondary | ICD-10-CM | POA: Diagnosis not present

## 2018-03-30 DIAGNOSIS — I4891 Unspecified atrial fibrillation: Secondary | ICD-10-CM | POA: Diagnosis not present

## 2018-03-30 DIAGNOSIS — R0609 Other forms of dyspnea: Secondary | ICD-10-CM | POA: Diagnosis not present

## 2018-04-01 DIAGNOSIS — I6523 Occlusion and stenosis of bilateral carotid arteries: Secondary | ICD-10-CM | POA: Diagnosis not present

## 2018-04-01 DIAGNOSIS — I4891 Unspecified atrial fibrillation: Secondary | ICD-10-CM | POA: Diagnosis not present

## 2018-04-01 DIAGNOSIS — G510 Bell's palsy: Secondary | ICD-10-CM | POA: Diagnosis not present

## 2018-04-01 DIAGNOSIS — R600 Localized edema: Secondary | ICD-10-CM | POA: Diagnosis not present

## 2018-04-01 DIAGNOSIS — R0609 Other forms of dyspnea: Secondary | ICD-10-CM | POA: Diagnosis not present

## 2018-04-01 DIAGNOSIS — H02103 Unspecified ectropion of right eye, unspecified eyelid: Secondary | ICD-10-CM | POA: Diagnosis not present

## 2018-04-02 DIAGNOSIS — R0609 Other forms of dyspnea: Secondary | ICD-10-CM | POA: Diagnosis not present

## 2018-04-02 DIAGNOSIS — H02103 Unspecified ectropion of right eye, unspecified eyelid: Secondary | ICD-10-CM | POA: Diagnosis not present

## 2018-04-02 DIAGNOSIS — R600 Localized edema: Secondary | ICD-10-CM | POA: Diagnosis not present

## 2018-04-02 DIAGNOSIS — G510 Bell's palsy: Secondary | ICD-10-CM | POA: Diagnosis not present

## 2018-04-02 DIAGNOSIS — I6523 Occlusion and stenosis of bilateral carotid arteries: Secondary | ICD-10-CM | POA: Diagnosis not present

## 2018-04-02 DIAGNOSIS — I4891 Unspecified atrial fibrillation: Secondary | ICD-10-CM | POA: Diagnosis not present

## 2018-04-06 DIAGNOSIS — R600 Localized edema: Secondary | ICD-10-CM | POA: Diagnosis not present

## 2018-04-06 DIAGNOSIS — I6523 Occlusion and stenosis of bilateral carotid arteries: Secondary | ICD-10-CM | POA: Diagnosis not present

## 2018-04-06 DIAGNOSIS — I495 Sick sinus syndrome: Secondary | ICD-10-CM | POA: Diagnosis not present

## 2018-04-06 DIAGNOSIS — R0602 Shortness of breath: Secondary | ICD-10-CM | POA: Diagnosis not present

## 2018-04-06 DIAGNOSIS — R0609 Other forms of dyspnea: Secondary | ICD-10-CM | POA: Diagnosis not present

## 2018-04-06 DIAGNOSIS — G510 Bell's palsy: Secondary | ICD-10-CM | POA: Diagnosis not present

## 2018-04-06 DIAGNOSIS — Z950 Presence of cardiac pacemaker: Secondary | ICD-10-CM | POA: Diagnosis not present

## 2018-04-06 DIAGNOSIS — I4891 Unspecified atrial fibrillation: Secondary | ICD-10-CM | POA: Diagnosis not present

## 2018-04-06 DIAGNOSIS — I480 Paroxysmal atrial fibrillation: Secondary | ICD-10-CM | POA: Diagnosis not present

## 2018-04-06 DIAGNOSIS — H02103 Unspecified ectropion of right eye, unspecified eyelid: Secondary | ICD-10-CM | POA: Diagnosis not present

## 2018-04-07 DIAGNOSIS — Z961 Presence of intraocular lens: Secondary | ICD-10-CM | POA: Diagnosis not present

## 2018-04-07 DIAGNOSIS — H401132 Primary open-angle glaucoma, bilateral, moderate stage: Secondary | ICD-10-CM | POA: Diagnosis not present

## 2018-04-07 DIAGNOSIS — H04123 Dry eye syndrome of bilateral lacrimal glands: Secondary | ICD-10-CM | POA: Diagnosis not present

## 2018-04-08 DIAGNOSIS — I083 Combined rheumatic disorders of mitral, aortic and tricuspid valves: Secondary | ICD-10-CM | POA: Diagnosis not present

## 2018-04-08 DIAGNOSIS — I519 Heart disease, unspecified: Secondary | ICD-10-CM | POA: Diagnosis not present

## 2018-04-08 DIAGNOSIS — I517 Cardiomegaly: Secondary | ICD-10-CM | POA: Diagnosis not present

## 2018-04-08 DIAGNOSIS — Z950 Presence of cardiac pacemaker: Secondary | ICD-10-CM | POA: Diagnosis not present

## 2018-04-10 DIAGNOSIS — R600 Localized edema: Secondary | ICD-10-CM | POA: Diagnosis not present

## 2018-04-10 DIAGNOSIS — I4891 Unspecified atrial fibrillation: Secondary | ICD-10-CM | POA: Diagnosis not present

## 2018-04-10 DIAGNOSIS — H02103 Unspecified ectropion of right eye, unspecified eyelid: Secondary | ICD-10-CM | POA: Diagnosis not present

## 2018-04-10 DIAGNOSIS — G510 Bell's palsy: Secondary | ICD-10-CM | POA: Diagnosis not present

## 2018-04-10 DIAGNOSIS — I6523 Occlusion and stenosis of bilateral carotid arteries: Secondary | ICD-10-CM | POA: Diagnosis not present

## 2018-04-10 DIAGNOSIS — R0609 Other forms of dyspnea: Secondary | ICD-10-CM | POA: Diagnosis not present

## 2018-04-13 ENCOUNTER — Telehealth: Payer: Self-pay | Admitting: Osteopathic Medicine

## 2018-04-13 DIAGNOSIS — H02103 Unspecified ectropion of right eye, unspecified eyelid: Secondary | ICD-10-CM | POA: Diagnosis not present

## 2018-04-13 DIAGNOSIS — R600 Localized edema: Secondary | ICD-10-CM | POA: Diagnosis not present

## 2018-04-13 DIAGNOSIS — G510 Bell's palsy: Secondary | ICD-10-CM | POA: Diagnosis not present

## 2018-04-13 DIAGNOSIS — I6523 Occlusion and stenosis of bilateral carotid arteries: Secondary | ICD-10-CM | POA: Diagnosis not present

## 2018-04-13 DIAGNOSIS — R0609 Other forms of dyspnea: Secondary | ICD-10-CM | POA: Diagnosis not present

## 2018-04-13 DIAGNOSIS — I4891 Unspecified atrial fibrillation: Secondary | ICD-10-CM | POA: Diagnosis not present

## 2018-04-13 NOTE — Telephone Encounter (Signed)
Home supply company needs a script faxed to them for a Multi -wheeled walker with seat for patient. This script needs to be faxed to [phone]. If any problems or questions we can call [phone]

## 2018-04-14 MED ORDER — AMBULATORY NON FORMULARY MEDICATION: 99 refills | Status: AC

## 2018-04-14 NOTE — Telephone Encounter (Signed)
Printed Rx and placed in fax box

## 2018-04-16 ENCOUNTER — Other Ambulatory Visit: Payer: Self-pay | Admitting: Osteopathic Medicine

## 2018-04-16 ENCOUNTER — Telehealth: Payer: Self-pay

## 2018-04-16 NOTE — Telephone Encounter (Signed)
Abigail Butts from [facility] called requesting a rx for a 4 - wheel walker with seat. Rx order to be faxed to [phone]. In addition, she is requesting a verbal order to continue physical therapy once a wk for 3 weeks. Returned call back to [place], no answer. Unable to leave a vm msg. Voicemail box is full.

## 2018-04-17 NOTE — Telephone Encounter (Signed)
Left a detailed vm msg for [place] Dragon at [facility]. Aware that rx for walker was sent already & verbal order given to continue physical therapy for pt. Direct call back info provided.

## 2018-04-17 NOTE — Telephone Encounter (Signed)
I think we sent an Rx for walker already Can try again later to give verbal OK for PT

## 2018-04-20 DIAGNOSIS — R0609 Other forms of dyspnea: Secondary | ICD-10-CM | POA: Diagnosis not present

## 2018-04-20 DIAGNOSIS — H02103 Unspecified ectropion of right eye, unspecified eyelid: Secondary | ICD-10-CM | POA: Diagnosis not present

## 2018-04-20 DIAGNOSIS — I6523 Occlusion and stenosis of bilateral carotid arteries: Secondary | ICD-10-CM | POA: Diagnosis not present

## 2018-04-20 DIAGNOSIS — R600 Localized edema: Secondary | ICD-10-CM | POA: Diagnosis not present

## 2018-04-20 DIAGNOSIS — I4891 Unspecified atrial fibrillation: Secondary | ICD-10-CM | POA: Diagnosis not present

## 2018-04-20 DIAGNOSIS — G510 Bell's palsy: Secondary | ICD-10-CM | POA: Diagnosis not present

## 2018-04-22 DIAGNOSIS — I6523 Occlusion and stenosis of bilateral carotid arteries: Secondary | ICD-10-CM | POA: Diagnosis not present

## 2018-04-22 DIAGNOSIS — R0609 Other forms of dyspnea: Secondary | ICD-10-CM | POA: Diagnosis not present

## 2018-04-22 DIAGNOSIS — D509 Iron deficiency anemia, unspecified: Secondary | ICD-10-CM | POA: Diagnosis not present

## 2018-04-22 DIAGNOSIS — Z8679 Personal history of other diseases of the circulatory system: Secondary | ICD-10-CM | POA: Diagnosis not present

## 2018-04-22 DIAGNOSIS — I4891 Unspecified atrial fibrillation: Secondary | ICD-10-CM | POA: Diagnosis not present

## 2018-04-22 DIAGNOSIS — Z8543 Personal history of malignant neoplasm of ovary: Secondary | ICD-10-CM | POA: Diagnosis not present

## 2018-04-22 DIAGNOSIS — Z85828 Personal history of other malignant neoplasm of skin: Secondary | ICD-10-CM | POA: Diagnosis not present

## 2018-04-22 DIAGNOSIS — Z7722 Contact with and (suspected) exposure to environmental tobacco smoke (acute) (chronic): Secondary | ICD-10-CM | POA: Diagnosis not present

## 2018-04-22 DIAGNOSIS — K259 Gastric ulcer, unspecified as acute or chronic, without hemorrhage or perforation: Secondary | ICD-10-CM | POA: Diagnosis not present

## 2018-04-22 DIAGNOSIS — H02103 Unspecified ectropion of right eye, unspecified eyelid: Secondary | ICD-10-CM | POA: Diagnosis not present

## 2018-04-22 DIAGNOSIS — R600 Localized edema: Secondary | ICD-10-CM | POA: Diagnosis not present

## 2018-04-22 DIAGNOSIS — G510 Bell's palsy: Secondary | ICD-10-CM | POA: Diagnosis not present

## 2018-04-22 DIAGNOSIS — Z79899 Other long term (current) drug therapy: Secondary | ICD-10-CM | POA: Diagnosis not present

## 2018-04-22 DIAGNOSIS — Z950 Presence of cardiac pacemaker: Secondary | ICD-10-CM | POA: Diagnosis not present

## 2018-04-22 DIAGNOSIS — M8580 Other specified disorders of bone density and structure, unspecified site: Secondary | ICD-10-CM | POA: Diagnosis not present

## 2018-04-22 DIAGNOSIS — Z7901 Long term (current) use of anticoagulants: Secondary | ICD-10-CM | POA: Diagnosis not present

## 2018-04-29 DIAGNOSIS — R0609 Other forms of dyspnea: Secondary | ICD-10-CM | POA: Diagnosis not present

## 2018-04-29 DIAGNOSIS — I6523 Occlusion and stenosis of bilateral carotid arteries: Secondary | ICD-10-CM | POA: Diagnosis not present

## 2018-04-29 DIAGNOSIS — H02103 Unspecified ectropion of right eye, unspecified eyelid: Secondary | ICD-10-CM | POA: Diagnosis not present

## 2018-04-29 DIAGNOSIS — I4891 Unspecified atrial fibrillation: Secondary | ICD-10-CM | POA: Diagnosis not present

## 2018-04-29 DIAGNOSIS — R600 Localized edema: Secondary | ICD-10-CM | POA: Diagnosis not present

## 2018-04-29 DIAGNOSIS — G510 Bell's palsy: Secondary | ICD-10-CM | POA: Diagnosis not present

## 2018-05-05 DIAGNOSIS — Z950 Presence of cardiac pacemaker: Secondary | ICD-10-CM | POA: Diagnosis not present

## 2018-05-05 DIAGNOSIS — I495 Sick sinus syndrome: Secondary | ICD-10-CM | POA: Diagnosis not present

## 2018-05-05 DIAGNOSIS — R0602 Shortness of breath: Secondary | ICD-10-CM | POA: Diagnosis not present

## 2018-05-05 DIAGNOSIS — I480 Paroxysmal atrial fibrillation: Secondary | ICD-10-CM | POA: Diagnosis not present

## 2018-05-12 ENCOUNTER — Other Ambulatory Visit: Payer: Self-pay | Admitting: Osteopathic Medicine

## 2018-05-12 NOTE — Telephone Encounter (Signed)
Routing to Dr Alexander's rx refill pool 

## 2018-06-02 ENCOUNTER — Ambulatory Visit: Payer: Medicare Other

## 2018-06-02 ENCOUNTER — Ambulatory Visit: Payer: Medicare Other | Admitting: Osteopathic Medicine

## 2018-06-02 DIAGNOSIS — I480 Paroxysmal atrial fibrillation: Secondary | ICD-10-CM | POA: Diagnosis not present

## 2018-06-02 DIAGNOSIS — Z950 Presence of cardiac pacemaker: Secondary | ICD-10-CM | POA: Diagnosis not present

## 2018-06-23 ENCOUNTER — Encounter: Payer: Medicare Other | Admitting: Osteopathic Medicine

## 2018-06-29 ENCOUNTER — Encounter: Payer: Medicare Other | Admitting: Osteopathic Medicine

## 2018-07-01 ENCOUNTER — Other Ambulatory Visit: Payer: Self-pay | Admitting: Osteopathic Medicine

## 2018-07-02 ENCOUNTER — Encounter: Payer: Medicare Other | Admitting: Osteopathic Medicine

## 2018-07-02 ENCOUNTER — Encounter: Payer: Self-pay | Admitting: Osteopathic Medicine

## 2018-07-02 ENCOUNTER — Ambulatory Visit (INDEPENDENT_AMBULATORY_CARE_PROVIDER_SITE_OTHER): Payer: Medicare Other | Admitting: Osteopathic Medicine

## 2018-07-02 VITALS — BP 100/62 | HR 60 | Temp 98.4°F | Wt 129.3 lb

## 2018-07-02 DIAGNOSIS — Z0000 Encounter for general adult medical examination without abnormal findings: Secondary | ICD-10-CM | POA: Diagnosis not present

## 2018-07-02 DIAGNOSIS — R202 Paresthesia of skin: Secondary | ICD-10-CM

## 2018-07-02 DIAGNOSIS — R6889 Other general symptoms and signs: Secondary | ICD-10-CM

## 2018-07-02 NOTE — Patient Instructions (Signed)
Plan: Will check B12 levels today and a few other labs for the tingling/sleeping feeling in limbs.  Will ask your eye doctor about the drops for running eye.  OK to bring the Prolia (bone density medicine) to the office when you are cleared to get the injection.

## 2018-07-02 NOTE — Progress Notes (Addendum)
HPI: Amanda Travis is a 85 y.o. female  who presents to [facility] today, 07/02/18,  for Medicare Annual Wellness Exam  Patient presents for annual physical/Medicare wellness exam. Few minor complaints today. Eye watering more than usual. Has ophthalmologist.  Occasional tingling in tips of finger, would like B12 checked. Cold intolerance, feels chilled even with light breeze    Past medical, surgical, social and family history reviewed:  Patient Active Problem List   Diagnosis Date Noted  . Carotid stenosis, asymptomatic, bilateral 11/18/2017  . Right-sided Bell's palsy 11/18/2017  . Ectropion due to laxity of eyelid, right 11/18/2017  . Osteopenia determined by x-ray 06/25/2017  . Advance directive discussed with patient 05/28/2016  . Onychomycosis 05/28/2016  . Elevated blood pressure reading 04/03/2016  . Gastric erosions 12/10/2015  . Atrial fibrillation ([place]) 12/08/2015  . GIB (gastrointestinal bleeding) 12/08/2015  . History of cardiac pacemaker 12/08/2015  . Microcytic hypochromic anemia 12/08/2015  . Intractable cyclical vomiting with nausea 06/23/2015  . Gastroesophageal reflux disease with esophagitis 06/23/2015  . Gait instability 04/19/2015  . Cardiac pacemaker in situ 03/08/2015  . Cough 03/08/2015  . History of glaucoma 03/08/2015  . Palpitations 03/08/2015  . Cardiac risk counseling 03/08/2015  . History of tachycardia-bradycardia syndrome 03/08/2015  . Postmenopausal 03/08/2015  . Osteoporosis 03/08/2015  . Disorder of bone and cartilage 03/08/2015  . Hyperlipidemia 03/08/2015  . History of skin cancer 03/08/2015    Past Surgical History:  Procedure Laterality Date  . ABDOMINAL HYSTERECTOMY  1979  . APPENDECTOMY    . BLOOD CLOT  2006  . CHOLECYSTECTOMY    . COLON SURGERY  1998  . EYE SURGERY     CATARACT  . JOINT REPLACEMENT Bilateral    KNEE  . MOHS SURGERY  2015  . OVARIAN CANCER  1997  . TOOTH EXTRACTION       Social History   Socioeconomic History  . Marital status: Widowed    Spouse name: Not on file  . Number of children: Not on file  . Years of education: Not on file  . Highest education level: Not on file  Occupational History  . Not on file  Social Needs  . Financial resource strain: Not on file  . Food insecurity:    Worry: Not on file    Inability: Not on file  . Transportation needs:    Medical: Not on file    Non-medical: Not on file  Tobacco Use  . Smoking status: Never Smoker  . Smokeless tobacco: Never Used  Substance and Sexual Activity  . Alcohol use: No  . Drug use: No  . Sexual activity: Never  Lifestyle  . Physical activity:    Days per week: Not on file    Minutes per session: Not on file  . Stress: Not on file  Relationships  . Social connections:    Talks on phone: Not on file    Gets together: Not on file    Attends religious service: Not on file    Active member of club or organization: Not on file    Attends meetings of clubs or organizations: Not on file    Relationship status: Not on file  . Intimate partner violence:    Fear of current or ex partner: Not on file    Emotionally abused: Not on file    Physically abused: Not on file    Forced sexual activity: Not on file  Other Topics Concern  . Not  on file  Social History Narrative  . Not on file    Family History  Problem Relation Age of Onset  . Stroke Mother   . Heart disease Sister   . Dementia Brother   . Heart disease Brother      Current medication list and allergy/intolerance information reviewed:    Outpatient Encounter Medications as of 07/02/2018  Medication Sig  . AMBULATORY NON FORMULARY MEDICATION Multi-wheeled walker with seat. Dx: ambulatory dysfunction, fall risk, osteoarthritis. Fax Rx to [phone]  . apixaban (ELIQUIS) 2.5 MG TABS tablet Take 2.5 mg by mouth 2 (two) times daily.  . ARTIFICIAL TEARS 0.1-0.3 % SOLN Apply to eye as needed.   . Calcium  Carbonate-Vitamin D 600-400 MG-UNIT chew tablet Chew 2 tablets by mouth daily.  . dorzolamide-timolol (COSOPT) 22.3-6.8 MG/ML ophthalmic solution INT 1 GTT IN OU BID  . Echinacea 125 MG CAPS Take by mouth. Take 200 mg by mouth daily  . Flaxseed, Linseed, (FLAXSEED OIL PO) Take by mouth.  . Garlic 7017 MG TABS Take by mouth. Take 1000 mg by mouth daily  . Glucosamine Sulfate 500 MG TABS Take by mouth. Take 200 mg by mouth daily  . latanoprost (XALATAN) 0.005 % ophthalmic solution 1 drop at bedtime.  Marland Kitchen loratadine (CLARITIN) 10 MG tablet Take 10 mg by mouth daily.  Marland Kitchen Lysine 500 MG CAPS Take by mouth. Take 1000 mg by mouth daily  . Melatonin 5 MG TABS Take by mouth. Take 5 mg by mouth daily  . Multiple Vitamin (MULTIVITAMIN) tablet Take 1 tablet by mouth daily.  Marland Kitchen oxybutynin (DITROPAN) 5 MG tablet TAKE 1 TABLET(5 MG) BY MOUTH TWICE DAILY  . pantoprazole (PROTONIX) 40 MG tablet Take 40 mg by mouth daily.   . Policosanol 10 MG CAPS Take by mouth. Take by mouth 2 times daily  . Potassium 99 MG TABS Take by mouth.  . pravastatin (PRAVACHOL) 10 MG tablet TAKE 1 TABLET(10 MG) BY MOUTH DAILY  . PROLIA 60 MG/ML SOSY injection INJ 60 MG INTO THE SKIN ONCE FOR 1 DOSE  . pyridOXINE (VITAMIN B-6) 100 MG tablet Take 100 mg by mouth daily.  . vitamin C (ASCORBIC ACID) 500 MG tablet Take 500 mg by mouth daily.   No facility-administered encounter medications on file as of 07/02/2018.     Allergies  Allergen Reactions  . Cephalexin Diarrhea and Other (See Comments)  . Valacyclovir Hcl Other (See Comments)  . Atorvastatin Diarrhea  . Tetracyclines & Related Diarrhea  . Valacyclovir Diarrhea       Review of Systems: Review of Systems - Negative except per hPI   Medicare Wellness Questionnaire  Are there smokers in your home (other than you)? no  Depression Screen (Note: if answer to either of the following is "Yes", a more complete depression screening is indicated)   Q1: Over the past two  weeks, have you felt down, depressed or hopeless? no  Q2: Over the past two weeks, have you felt little interest or pleasure in doing things? no  Have you lost interest or pleasure in daily life? no  Do you often feel hopeless? no  Do you cry easily over simple problems? no  Activities of Daily Living In your present state of health, do you have any difficulty performing the following activities?:  Driving? yes Managing money?  no Feeding yourself? no Getting from bed to chair? yes Climbing a flight of stairs? yes Preparing food and eating?: no Bathing or showering? no Getting dressed: no  Getting to the toilet? no Using the toilet: no Moving around from place to place: yes In the past year have you fallen or had a near fall?: no  Hearing Difficulties:  Do you often ask people to speak up or repeat themselves? yes Do you experience ringing or noises in your ears? no  Do you have difficulty understanding soft or whispered voices? no  Memory Difficulties:  Do you feel that you have a problem with memory? no  Do you often misplace items? no  Do you feel safe at home?  yes  Sexual Health:   Are you sexually active?  No  Do you have more than one partner?  No  Advanced Directives:   Advanced directives discussed: has an advanced directive - a copy HAS NOT been provided  Additional information provided: no  Risk Factors  Current exercise habits: minimal, walks some  Dietary issues discussed:no concerns  Cardiac risk factors: known cardiac disease, hypertension, hypercholesterolemia/hyperlipidemia, generalized debilitation  Other doctors: [facility] in [place] NP [facility] in [place], Dr. Lilyan Gilford   Exam:  BP 100/62 (BP Location: Left Arm, Patient Position: Sitting, Cuff Size: Normal)   Pulse 60   Temp 98.4 F (36.9 C) (Oral)   Wt 129 lb 4.8 oz (58.7 kg)   BMI 24.43 kg/m  Vision by Snellen chart: right  eye:see nurse notes, left eye:see nurse notes  Constitutional: VS see above. General Appearance: alert, well-developed, well-nourished, NAD  Ears, Nose, Mouth, Throat: MMM  Neck: No masses, trachea midline.   Respiratory: Normal respiratory effort. no wheeze, no rhonchi, no rales  Cardiovascular:No lower extremity edema.   Musculoskeletal: Gait normal. No clubbing/cyanosis of digits.   Neurological: Normal balance/coordination. No tremor. Recalls 3 objects and able to read face of watch with correct time.   Skin: warm, dry, intact. No rash/ulcer.   Psychiatric: Normal judgment/insight. Normal mood and affect. Oriented x3.     ASSESSMENT/PLAN:   Encounter for Medicare annual wellness exam  Medicare annual wellness visit, subsequent  Paresthesias - Plan: CBC, COMPLETE METABOLIC PANEL WITH GFR, TSH, Vitamin B12  Cold intolerance - Plan: CBC, COMPLETE METABOLIC PANEL WITH GFR, TSH, Vitamin B12  CANCER SCREENING  Lung - USPSTF: 55-80yo w/ 30 py hx unless quit w/in 42yr - does not need  Colon - does not need  Prostate - does not need  Breast - does not need  Cervical - does not need OTHER DISEASE SCREENING  Lipid - needs  DM2 - needs  AAA - female 59-75yo ever smoked - does not need  Osteoporosis - women 83yo+, men 83yo+ - does not need, known OP INFECTIOUS DISEASE SCREENING  HIV - does not need  GC/CT - does not need  HepC -does not need  TB - does not need ADULT VACCINATION  Influenza - annual vaccine recommended  Td - booster every 10 years - does not need  Zoster - option at 50, yes at 35+ - does not need  PCV13 - does not need  PPSV23 - does not need Immunization History  Administered Date(s) Administered  . Influenza Split 12/08/2014, 12/12/2015  . Influenza, High Dose Seasonal PF 11/26/2016, 11/18/2017  . Influenza, Seasonal, Injecte, Preservative Fre 12/08/2014, 12/12/2015, 11/26/2016  . Influenza-Unspecified 12/08/2014, 12/12/2015,  11/26/2016, 11/18/2017  . Pneumococcal Conjugate-13 10/19/2013  . Pneumococcal Polysaccharide-23 12/08/2014  . Tdap 04/05/2015  . Zoster 10/21/2013  . Zoster Recombinat (Shingrix) 05/30/2016, 08/05/2016   OTHER  Fall - exercise and Vit  D age 34+ - needs  Advanced Directives -  Discussed as above   During the course of the visit the patient was educated and counseled about appropriate screening and preventive services as noted above.   Patient Instructions (the written plan) was given to the patient.  Medicare Attestation I have personally reviewed: The patient's medical and social history Their use of alcohol, tobacco or illicit drugs Their current medications and supplements The patient's functional ability including ADLs,fall risks, home safety risks, cognitive, and hearing and visual impairment Diet and physical activities Evidence for depression or mood disorders  The patient's weight, height, BMI, and visual acuity have been recorded in the chart.  I have made referrals, counseling, and provided education to the patient based on review of the above and I have provided the patient with a written personalized care plan for preventive services.     Emeterio Reeve, DO   07/03/18   Visit summary with medication list and pertinent instructions was printed for patient to review. All questions at time of visit were answered - patient instructed to contact office with any additional concerns. ER/RTC precautions were reviewed with the patient. Follow-up plan: Return in about 6 months (around 01/02/2019) for routine follow-up and labs, sooner if needed! Marland Kitchen

## 2018-07-03 LAB — COMPLETE METABOLIC PANEL WITH GFR
AG Ratio: 1.5 (calc) (ref 1.0–2.5)
ALT: 10 U/L (ref 6–29)
AST: 17 U/L (ref 10–35)
Albumin: 4.1 g/dL (ref 3.6–5.1)
Alkaline phosphatase (APISO): 58 U/L (ref 37–153)
BUN/Creatinine Ratio: 22 (calc) (ref 6–22)
BUN: 20 mg/dL (ref 7–25)
CO2: 28 mmol/L (ref 20–32)
Calcium: 9.5 mg/dL (ref 8.6–10.4)
Chloride: 100 mmol/L (ref 98–110)
Creat: 0.92 mg/dL — ABNORMAL HIGH (ref 0.60–0.88)
GFR, Est African American: 65 mL/min/{1.73_m2} (ref >=60)
GFR, Est Non African American: 56 mL/min/{1.73_m2} — ABNORMAL LOW (ref >=60)
Globulin: 2.8 g/dL (calc) (ref 1.9–3.7)
Glucose, Bld: 145 mg/dL — ABNORMAL HIGH (ref 65–99)
Potassium: 4.2 mmol/L (ref 3.5–5.3)
Sodium: 138 mmol/L (ref 135–146)
Total Bilirubin: 1 mg/dL (ref 0.2–1.2)
Total Protein: 6.9 g/dL (ref 6.1–8.1)

## 2018-07-03 LAB — CBC
HCT: 41.8 % (ref 35.0–45.0)
Hemoglobin: 14.4 g/dL (ref 11.7–15.5)
MCH: 31.3 pg (ref 27.0–33.0)
MCHC: 34.4 g/dL (ref 32.0–36.0)
MCV: 90.9 fL (ref 80.0–100.0)
MPV: 9.9 fL (ref 7.5–12.5)
Platelets: 234 10*3/uL (ref 140–400)
RBC: 4.6 10*6/uL (ref 3.80–5.10)
RDW: 13.2 % (ref 11.0–15.0)
WBC: 6.4 10*3/uL (ref 3.8–10.8)

## 2018-07-03 LAB — TSH: TSH: 2.51 mIU/L (ref 0.40–4.50)

## 2018-07-03 LAB — VITAMIN B12: Vitamin B-12: 510 pg/mL (ref 200–1100)

## 2018-07-06 DIAGNOSIS — Z950 Presence of cardiac pacemaker: Secondary | ICD-10-CM | POA: Diagnosis not present

## 2018-07-06 DIAGNOSIS — I480 Paroxysmal atrial fibrillation: Secondary | ICD-10-CM | POA: Diagnosis not present

## 2018-07-06 DIAGNOSIS — I495 Sick sinus syndrome: Secondary | ICD-10-CM | POA: Diagnosis not present

## 2018-07-22 ENCOUNTER — Telehealth: Payer: Self-pay | Admitting: Osteopathic Medicine

## 2018-07-22 DIAGNOSIS — M810 Age-related osteoporosis without current pathological fracture: Secondary | ICD-10-CM

## 2018-07-22 DIAGNOSIS — E876 Hypokalemia: Secondary | ICD-10-CM

## 2018-07-22 NOTE — Telephone Encounter (Signed)
Labs placed and will call patient tomorrow.

## 2018-07-22 NOTE — Telephone Encounter (Deleted)
Pt lvm . She wants a bone density test

## 2018-07-22 NOTE — Telephone Encounter (Signed)
Pt called. She is needing Prolia injection

## 2018-07-23 DIAGNOSIS — E876 Hypokalemia: Secondary | ICD-10-CM | POA: Diagnosis not present

## 2018-07-23 DIAGNOSIS — M810 Age-related osteoporosis without current pathological fracture: Secondary | ICD-10-CM | POA: Diagnosis not present

## 2018-07-23 NOTE — Telephone Encounter (Signed)
Called patient and she will get labs done while we are waiting on the prolia verification to see if PA is needed. She did not have any other questions.

## 2018-07-24 NOTE — Progress Notes (Signed)
The falls were asked in the questionnaire, it is definitely in the progress note I addended to add her other specialists Thanks!

## 2018-07-27 ENCOUNTER — Telehealth: Payer: Self-pay

## 2018-07-27 ENCOUNTER — Other Ambulatory Visit: Payer: Self-pay | Admitting: Osteopathic Medicine

## 2018-07-27 LAB — COMPLETE METABOLIC PANEL WITH GFR
AG Ratio: 1.4 (calc) (ref 1.0–2.5)
ALT: 9 U/L (ref 6–29)
AST: 16 U/L (ref 10–35)
Albumin: 4 g/dL (ref 3.6–5.1)
Alkaline phosphatase (APISO): 58 U/L (ref 37–153)
BUN/Creatinine Ratio: 32 (calc) — ABNORMAL HIGH (ref 6–22)
BUN: 26 mg/dL — ABNORMAL HIGH (ref 7–25)
CO2: 25 mmol/L (ref 20–32)
Calcium: 8.9 mg/dL (ref 8.6–10.4)
Chloride: 100 mmol/L (ref 98–110)
Creat: 0.81 mg/dL (ref 0.60–0.88)
GFR, Est African American: 76 mL/min/{1.73_m2} (ref >=60)
GFR, Est Non African American: 65 mL/min/{1.73_m2} (ref >=60)
Globulin: 2.9 g/dL (calc) (ref 1.9–3.7)
Glucose, Bld: 154 mg/dL — ABNORMAL HIGH (ref 65–99)
Potassium: 3.3 mmol/L — ABNORMAL LOW (ref 3.5–5.3)
Sodium: 138 mmol/L (ref 135–146)
Total Bilirubin: 0.8 mg/dL (ref 0.2–1.2)
Total Protein: 6.9 g/dL (ref 6.1–8.1)

## 2018-07-27 LAB — TEST AUTHORIZATION

## 2018-07-27 LAB — MAGNESIUM: Magnesium: 1.3 mg/dL — ABNORMAL LOW (ref 1.5–2.5)

## 2018-07-27 MED ORDER — MAGNESIUM CHLORIDE 64 MG PO TBEC: 2.0000 | DELAYED_RELEASE_TABLET | Freq: Every day | ORAL | 1 refills | Status: DC

## 2018-07-27 NOTE — Telephone Encounter (Signed)
Pt's daughter is requesting a call back to set up a Nurse Visit appt for prolia injection. Thanks.

## 2018-07-27 NOTE — Telephone Encounter (Signed)
Prolia has been approved though 02/18/2019. Do we need to wait on giving prolia with abnormal labs? Please advise.

## 2018-07-27 NOTE — Addendum Note (Signed)
Addended by: Maryla Morrow on: 07/27/2018 03:39 PM   Modules accepted: Orders

## 2018-07-27 NOTE — Telephone Encounter (Signed)
Thank you :)

## 2018-07-27 NOTE — Telephone Encounter (Signed)
No, ok to give prolia

## 2018-07-27 NOTE — Telephone Encounter (Signed)
Patient will do a drive by for Prolia and she has one that she will bring in the office. Daughter will bring her to the appointment in her car.

## 2018-07-31 ENCOUNTER — Ambulatory Visit (INDEPENDENT_AMBULATORY_CARE_PROVIDER_SITE_OTHER): Payer: Medicare Other | Admitting: Family Medicine

## 2018-07-31 ENCOUNTER — Encounter: Payer: Self-pay | Admitting: Family Medicine

## 2018-07-31 VITALS — Temp 98.3°F

## 2018-07-31 DIAGNOSIS — M818 Other osteoporosis without current pathological fracture: Secondary | ICD-10-CM

## 2018-07-31 MED ORDER — DENOSUMAB 60 MG/ML ~~LOC~~ SOSY
60.0000 mg | PREFILLED_SYRINGE | Freq: Once | SUBCUTANEOUS | Status: AC
  Administered 2018-07-31: 60 mg via SUBCUTANEOUS

## 2018-07-31 NOTE — Progress Notes (Signed)
Agree with documentation as above.   Dimitriy Carreras, MD  

## 2018-07-31 NOTE — Progress Notes (Signed)
Patient presented to office via drive-up nurse visit for Prolia injection. Patient provided medication.   Prolia administered in right arm with no complications.

## 2018-08-20 DIAGNOSIS — E876 Hypokalemia: Secondary | ICD-10-CM | POA: Diagnosis not present

## 2018-08-21 LAB — BASIC METABOLIC PANEL WITH GFR
BUN: 20 mg/dL (ref 7–25)
CO2: 26 mmol/L (ref 20–32)
Calcium: 9 mg/dL (ref 8.6–10.4)
Chloride: 103 mmol/L (ref 98–110)
Creat: 0.83 mg/dL (ref 0.60–0.88)
GFR, Est African American: 73 mL/min/{1.73_m2} (ref >=60)
GFR, Est Non African American: 63 mL/min/{1.73_m2} (ref >=60)
Glucose, Bld: 134 mg/dL (ref 65–139)
Potassium: 3.7 mmol/L (ref 3.5–5.3)
Sodium: 138 mmol/L (ref 135–146)

## 2018-08-21 LAB — MAGNESIUM: Magnesium: 1.6 mg/dL (ref 1.5–2.5)

## 2018-09-29 ENCOUNTER — Other Ambulatory Visit: Payer: Self-pay | Admitting: Osteopathic Medicine

## 2018-09-29 NOTE — Telephone Encounter (Signed)
Refill request sent to [facility] refill pool.  Forwarding to PCP refill pool.

## 2018-10-07 DIAGNOSIS — I480 Paroxysmal atrial fibrillation: Secondary | ICD-10-CM | POA: Diagnosis not present

## 2018-10-07 DIAGNOSIS — Z950 Presence of cardiac pacemaker: Secondary | ICD-10-CM | POA: Diagnosis not present

## 2018-11-10 DIAGNOSIS — I495 Sick sinus syndrome: Secondary | ICD-10-CM | POA: Diagnosis not present

## 2019-01-04 ENCOUNTER — Ambulatory Visit: Payer: Medicare Other | Admitting: Osteopathic Medicine

## 2019-01-11 ENCOUNTER — Other Ambulatory Visit: Payer: Self-pay

## 2019-01-27 ENCOUNTER — Encounter: Payer: Self-pay | Admitting: Osteopathic Medicine

## 2019-01-27 ENCOUNTER — Ambulatory Visit (INDEPENDENT_AMBULATORY_CARE_PROVIDER_SITE_OTHER): Payer: Medicare Other | Admitting: Osteopathic Medicine

## 2019-01-27 VITALS — BP 112/60 | HR 60 | Wt 126.8 lb

## 2019-01-27 DIAGNOSIS — M818 Other osteoporosis without current pathological fracture: Secondary | ICD-10-CM | POA: Diagnosis not present

## 2019-01-27 NOTE — Progress Notes (Signed)
Virtual Visit via Phone  I connected with      Amanda Travis on 01/27/19 at 2:57 PM  by a telemedicine application and verified that I am speaking with the correct person using two identifiers.  Patient is at home I am in office   I discussed the limitations of evaluation and management by telemedicine and the availability of in person appointments. The patient expressed understanding and agreed to proceed.  History of Present Illness: Amanda Travis is a 85 y.o. female who would like to discuss medication refills, routine check-up  Patient feeling well, no major concerns.  She is about due for her Prolia injection     Observations/Objective: BP 112/60 Comment: pt. reported  Pulse 60 Comment: pt. repoerted  Wt 126 lb 12.8 oz (57.5 kg) Comment: pt. reported  BMI 23.96 kg/m  BP Readings from Last 3 Encounters:  01/27/19 112/60  07/02/18 100/62  03/26/18 135/75   Exam: Normal Speech.  NAD  Lab and Radiology Results No results found for this or any previous visit (from the past 72 hour(s)). No results found.     Assessment and Plan: 85 y.o. female with The encounter diagnosis was Other osteoporosis, unspecified pathological fracture presence.  Patient was advised to get labs done, as soon as I have results back, we should be able to go ahead and get her scheduled for Prolia injection  PDMP not reviewed this encounter. Orders Placed This Encounter  Procedures  . BASIC METABOLIC PANEL WITH GFR   No orders of the defined types were placed in this encounter.    Follow Up Instructions: Return for Prolia injection when due.    I discussed the assessment and treatment plan with the patient. The patient was provided an opportunity to ask questions and all were answered. The patient agreed with the plan and demonstrated an understanding of the instructions.   The patient was advised to call back or seek an in-person evaluation if any new concerns, if symptoms worsen  or if the condition fails to improve as anticipated.  15 minutes of non-face-to-face time was provided during this encounter.      . . . . . . . . . . . . . Marland Kitchen                   Historical information moved to improve visibility of documentation.  Past Medical History:  Diagnosis Date  . Cardiac pacemaker in situ 03/08/2015   Placed for Tachy/Brady syndrome   . Cardiac risk counseling 03/08/2015   Await lipids ot calculate ASCVD risk score. No hx known MI/CVA   . Cough 03/08/2015  . Disorder of bone and cartilage 03/08/2015  . Gait instability 04/19/2015  . History of glaucoma 03/08/2015  . History of skin cancer 03/08/2015  . History of tachycardia-bradycardia syndrome 03/08/2015  . Hyperlipidemia 03/08/2015  . Osteoporosis 03/08/2015   previously on Fosamax, pt told to stop this due to dental procedure. Will repeat DEXA.    Marland Kitchen Palpitations 03/08/2015   Pt concerned for Afib.CHA2DDS2-VASc Score 3.2%. Pt reports pacemaker check revealed AFib but I don't see Afib on PM reports, EKG sinus   . Postmenopausal 03/08/2015   Past Surgical History:  Procedure Laterality Date  . ABDOMINAL HYSTERECTOMY  1979  . APPENDECTOMY    . BLOOD CLOT  2006  . CHOLECYSTECTOMY    . COLON SURGERY  1998  . EYE SURGERY     CATARACT  . JOINT REPLACEMENT Bilateral  KNEE  . MOHS SURGERY  2015  . OVARIAN CANCER  1997  . TOOTH EXTRACTION     Social History   Tobacco Use  . Smoking status: Never Smoker  . Smokeless tobacco: Never Used  Substance Use Topics  . Alcohol use: No   family history includes Dementia in her brother; Heart disease in her brother and sister; Stroke in her mother.  Medications: Current Outpatient Medications  Medication Sig Dispense Refill  . AMBULATORY NON FORMULARY MEDICATION Multi-wheeled walker with seat. Dx: ambulatory dysfunction, fall risk, osteoarthritis. Fax Rx to [phone] 1 Units prn  . apixaban (ELIQUIS) 2.5 MG TABS tablet Take 2.5 mg  by mouth 2 (two) times daily.    . ARTIFICIAL TEARS 0.1-0.3 % SOLN Apply to eye as needed.     . Calcium Carbonate-Vitamin D 600-400 MG-UNIT chew tablet Chew 2 tablets by mouth daily. 60 tablet 12  . dorzolamide-timolol (COSOPT) 22.3-6.8 MG/ML ophthalmic solution Place 1 drop into both eyes 2 (two) times daily.   11  . Echinacea 125 MG CAPS Take by mouth. Take 200 mg by mouth daily    . Flaxseed, Linseed, (FLAXSEED OIL PO) Take 1,000 mg by mouth. 2 tablets daily    . furosemide (LASIX) 40 MG tablet Take 40 mg by mouth daily.    . Garlic [id number] MG TABS Take by mouth. Take 1000 mg by mouth daily    . Glucosamine Sulfate 500 MG TABS Take by mouth. Take 200 mg by mouth daily    . latanoprost (XALATAN) 0.005 % ophthalmic solution Place 1 drop into both eyes at bedtime.     Marland Kitchen loratadine (CLARITIN) 10 MG tablet Take 10 mg by mouth daily.    Marland Kitchen Lysine 500 MG CAPS Take by mouth. Take 1000 mg by mouth daily    . Magnesium Chloride 64 MG TBEC Take 2 tablets (128 mg total) by mouth daily. 180 tablet 1  . Melatonin 5 MG TABS Take by mouth. Take 5 mg by mouth daily    . Multiple Vitamin (MULTIVITAMIN) tablet Take 1 tablet by mouth daily.    Marland Kitchen oxybutynin (DITROPAN) 5 MG tablet TAKE 1 TABLET(5 MG) BY MOUTH TWICE DAILY 180 tablet 1  . pantoprazole (PROTONIX) 40 MG tablet Take 40 mg by mouth daily.     . Policosanol 10 MG CAPS Take by mouth. Take by mouth 2 times daily    . Potassium 99 MG TABS Take 1 tablet by mouth daily.     . pravastatin (PRAVACHOL) 10 MG tablet TAKE 1 TABLET(10 MG) BY MOUTH DAILY 90 tablet 1  . Probiotic Product (PROBIOTIC MULTI-ENZYME PO) Take 80 mg by mouth. 2 tablets daily    . PROLIA 60 MG/ML SOSY injection Inject 60 mg into the skin every 6 (six) months.   0  . pyridOXINE (VITAMIN B-6) 100 MG tablet Take 100 mg by mouth daily.    . vitamin B-12 (CYANOCOBALAMIN) 100 MCG tablet Take 100 mcg by mouth daily.    . vitamin C (ASCORBIC ACID) 500 MG tablet Take 500 mg by mouth daily.     No  current facility-administered medications for this visit.    Allergies  Allergen Reactions  . Cephalexin Diarrhea and Other (See Comments)  . Valacyclovir Hcl Other (See Comments)  . Atorvastatin Diarrhea  . Tetracyclines & Related Diarrhea  . Valacyclovir Diarrhea

## 2019-02-04 ENCOUNTER — Telehealth: Payer: Self-pay | Admitting: Osteopathic Medicine

## 2019-02-04 DIAGNOSIS — E876 Hypokalemia: Secondary | ICD-10-CM

## 2019-02-04 DIAGNOSIS — M810 Age-related osteoporosis without current pathological fracture: Secondary | ICD-10-CM

## 2019-02-04 MED ORDER — MAGNESIUM CHLORIDE 64 MG PO TBEC: 2.0000 | DELAYED_RELEASE_TABLET | Freq: Every day | ORAL | 0 refills | Status: DC

## 2019-02-04 NOTE — Telephone Encounter (Signed)
-----   Message from Amanda Travis, [place] sent at 02/03/2019  3:41 PM EST ----- Can you call this patient and let her know labs are in and once we get them please call to schedule a nurse visit for prolia?

## 2019-02-04 NOTE — Telephone Encounter (Signed)
90 day sent 

## 2019-02-04 NOTE — Telephone Encounter (Signed)
Ok to refill 90 days supply

## 2019-02-04 NOTE — Telephone Encounter (Signed)
Patient said that she was going to call back to take care of this because she is having surgery on December 31st. She does not know when she can come in. She is out of her: Magnesium Chloride 64 MG TBEC [id number] and needs a refill on this. Thank you.

## 2019-03-01 DIAGNOSIS — I495 Sick sinus syndrome: Secondary | ICD-10-CM | POA: Diagnosis not present

## 2019-03-10 DIAGNOSIS — H401132 Primary open-angle glaucoma, bilateral, moderate stage: Secondary | ICD-10-CM | POA: Diagnosis not present

## 2019-03-29 ENCOUNTER — Other Ambulatory Visit: Payer: Self-pay

## 2019-03-29 DIAGNOSIS — Z45010 Encounter for checking and testing of cardiac pacemaker pulse generator [battery]: Secondary | ICD-10-CM | POA: Diagnosis not present

## 2019-03-29 DIAGNOSIS — I480 Paroxysmal atrial fibrillation: Secondary | ICD-10-CM | POA: Diagnosis not present

## 2019-03-29 DIAGNOSIS — Z45018 Encounter for adjustment and management of other part of cardiac pacemaker: Secondary | ICD-10-CM | POA: Diagnosis not present

## 2019-03-29 DIAGNOSIS — I495 Sick sinus syndrome: Secondary | ICD-10-CM | POA: Diagnosis not present

## 2019-03-29 MED ORDER — OXYBUTYNIN CHLORIDE 5 MG PO TABS: ORAL_TABLET | ORAL | 1 refills | Status: DC

## 2019-03-29 MED ORDER — PRAVASTATIN SODIUM 10 MG PO TABS: ORAL_TABLET | ORAL | 1 refills | Status: DC

## 2019-04-02 DIAGNOSIS — Z20822 Contact with and (suspected) exposure to covid-19: Secondary | ICD-10-CM | POA: Diagnosis not present

## 2019-04-02 DIAGNOSIS — I4891 Unspecified atrial fibrillation: Secondary | ICD-10-CM | POA: Diagnosis not present

## 2019-04-02 DIAGNOSIS — I495 Sick sinus syndrome: Secondary | ICD-10-CM | POA: Diagnosis not present

## 2019-04-02 DIAGNOSIS — Z45010 Encounter for checking and testing of cardiac pacemaker pulse generator [battery]: Secondary | ICD-10-CM | POA: Diagnosis not present

## 2019-04-02 DIAGNOSIS — G510 Bell's palsy: Secondary | ICD-10-CM | POA: Diagnosis not present

## 2019-04-02 DIAGNOSIS — Z888 Allergy status to other drugs, medicaments and biological substances status: Secondary | ICD-10-CM | POA: Diagnosis not present

## 2019-04-07 DIAGNOSIS — I495 Sick sinus syndrome: Secondary | ICD-10-CM | POA: Diagnosis not present

## 2019-04-07 DIAGNOSIS — Z950 Presence of cardiac pacemaker: Secondary | ICD-10-CM | POA: Diagnosis not present

## 2019-04-07 DIAGNOSIS — I442 Atrioventricular block, complete: Secondary | ICD-10-CM | POA: Diagnosis not present

## 2019-04-07 DIAGNOSIS — G510 Bell's palsy: Secondary | ICD-10-CM | POA: Diagnosis not present

## 2019-04-07 DIAGNOSIS — I4891 Unspecified atrial fibrillation: Secondary | ICD-10-CM | POA: Diagnosis not present

## 2019-04-07 DIAGNOSIS — Z888 Allergy status to other drugs, medicaments and biological substances status: Secondary | ICD-10-CM | POA: Diagnosis not present

## 2019-04-07 DIAGNOSIS — Z45010 Encounter for checking and testing of cardiac pacemaker pulse generator [battery]: Secondary | ICD-10-CM | POA: Diagnosis not present

## 2019-05-07 ENCOUNTER — Telehealth: Payer: Self-pay

## 2019-05-07 DIAGNOSIS — M810 Age-related osteoporosis without current pathological fracture: Secondary | ICD-10-CM

## 2019-05-07 NOTE — Telephone Encounter (Signed)
Amanda Travis is due for Prolia injection. Labs ordered, patient advised to have labs drawn on Monday. Amanda Travis scheduled for Wednesday next week. No PA required.

## 2019-05-10 DIAGNOSIS — M810 Age-related osteoporosis without current pathological fracture: Secondary | ICD-10-CM | POA: Diagnosis not present

## 2019-05-10 LAB — COMPLETE METABOLIC PANEL WITH GFR
AG Ratio: 1.5 (calc) (ref 1.0–2.5)
ALT: 10 U/L (ref 6–29)
AST: 14 U/L (ref 10–35)
Albumin: 4 g/dL (ref 3.6–5.1)
Alkaline phosphatase (APISO): 61 U/L (ref 37–153)
BUN: 24 mg/dL (ref 7–25)
CO2: 28 mmol/L (ref 20–32)
Calcium: 9.2 mg/dL (ref 8.6–10.4)
Chloride: 105 mmol/L (ref 98–110)
Creat: 0.79 mg/dL (ref 0.60–0.88)
GFR, Est African American: 77 mL/min/{1.73_m2} (ref >=60)
GFR, Est Non African American: 67 mL/min/{1.73_m2} (ref >=60)
Globulin: 2.6 g/dL (calc) (ref 1.9–3.7)
Glucose, Bld: 165 mg/dL — ABNORMAL HIGH (ref 65–139)
Potassium: 4.2 mmol/L (ref 3.5–5.3)
Sodium: 140 mmol/L (ref 135–146)
Total Bilirubin: 0.8 mg/dL (ref 0.2–1.2)
Total Protein: 6.6 g/dL (ref 6.1–8.1)

## 2019-05-12 ENCOUNTER — Ambulatory Visit (INDEPENDENT_AMBULATORY_CARE_PROVIDER_SITE_OTHER): Payer: Medicare Other | Admitting: Osteopathic Medicine

## 2019-05-12 VITALS — Temp 98.0°F

## 2019-05-12 DIAGNOSIS — M810 Age-related osteoporosis without current pathological fracture: Secondary | ICD-10-CM | POA: Diagnosis not present

## 2019-05-12 MED ORDER — DENOSUMAB 60 MG/ML ~~LOC~~ SOSY
60.0000 mg | PREFILLED_SYRINGE | Freq: Once | SUBCUTANEOUS | Status: AC
  Administered 2019-05-12: 60 mg via SUBCUTANEOUS

## 2019-05-12 NOTE — Progress Notes (Signed)
Patient presents today as a Insurance underwriter visit for Prolia 60 mg/1 ml injection. Patient is scheduled to get this injection every 6 months. Patients last injection was given on 07/31/2018 in the right arm.   Patient denies falls, CP, palpitations, ShOB, dizziness/lightheadedness, abdominal pain, headache, and mood swings.   Injection given in right arm. Pt tolerated injection well without complications. Pt instructed call to schedule a nurse visit for the next injection that will be due in 6 months with lab work to be done prior.

## 2019-05-24 DIAGNOSIS — L570 Actinic keratosis: Secondary | ICD-10-CM | POA: Diagnosis not present

## 2019-05-24 DIAGNOSIS — L820 Inflamed seborrheic keratosis: Secondary | ICD-10-CM | POA: Diagnosis not present

## 2019-05-24 DIAGNOSIS — L821 Other seborrheic keratosis: Secondary | ICD-10-CM | POA: Diagnosis not present

## 2019-05-24 DIAGNOSIS — C44319 Basal cell carcinoma of skin of other parts of face: Secondary | ICD-10-CM | POA: Diagnosis not present

## 2019-05-24 DIAGNOSIS — C4441 Basal cell carcinoma of skin of scalp and neck: Secondary | ICD-10-CM | POA: Diagnosis not present

## 2019-05-24 DIAGNOSIS — C44329 Squamous cell carcinoma of skin of other parts of face: Secondary | ICD-10-CM | POA: Diagnosis not present

## 2019-05-24 DIAGNOSIS — D692 Other nonthrombocytopenic purpura: Secondary | ICD-10-CM | POA: Diagnosis not present

## 2019-05-27 DIAGNOSIS — D485 Neoplasm of uncertain behavior of skin: Secondary | ICD-10-CM | POA: Diagnosis not present

## 2019-06-01 ENCOUNTER — Other Ambulatory Visit: Payer: Self-pay

## 2019-06-01 DIAGNOSIS — E876 Hypokalemia: Secondary | ICD-10-CM

## 2019-06-01 DIAGNOSIS — M810 Age-related osteoporosis without current pathological fracture: Secondary | ICD-10-CM

## 2019-06-01 MED ORDER — MAGNESIUM CHLORIDE 64 MG PO TBEC: 2.0000 | DELAYED_RELEASE_TABLET | Freq: Every day | ORAL | 0 refills | Status: AC

## 2019-07-01 DIAGNOSIS — C44319 Basal cell carcinoma of skin of other parts of face: Secondary | ICD-10-CM | POA: Diagnosis not present

## 2019-07-01 DIAGNOSIS — C44399 Other specified malignant neoplasm of skin of other parts of face: Secondary | ICD-10-CM | POA: Diagnosis not present

## 2019-07-01 DIAGNOSIS — D485 Neoplasm of uncertain behavior of skin: Secondary | ICD-10-CM | POA: Diagnosis not present

## 2019-07-12 DIAGNOSIS — C44329 Squamous cell carcinoma of skin of other parts of face: Secondary | ICD-10-CM | POA: Diagnosis not present

## 2019-07-20 DIAGNOSIS — Z950 Presence of cardiac pacemaker: Secondary | ICD-10-CM | POA: Diagnosis not present

## 2019-07-20 DIAGNOSIS — I480 Paroxysmal atrial fibrillation: Secondary | ICD-10-CM | POA: Diagnosis not present

## 2019-07-20 DIAGNOSIS — I495 Sick sinus syndrome: Secondary | ICD-10-CM | POA: Diagnosis not present

## 2019-08-27 DIAGNOSIS — H401132 Primary open-angle glaucoma, bilateral, moderate stage: Secondary | ICD-10-CM | POA: Diagnosis not present

## 2019-09-15 DIAGNOSIS — C44319 Basal cell carcinoma of skin of other parts of face: Secondary | ICD-10-CM | POA: Diagnosis not present

## 2019-09-24 ENCOUNTER — Other Ambulatory Visit: Payer: Self-pay | Admitting: Osteopathic Medicine

## 2019-09-28 ENCOUNTER — Other Ambulatory Visit: Payer: Self-pay

## 2019-09-28 DIAGNOSIS — E7800 Pure hypercholesterolemia, unspecified: Secondary | ICD-10-CM

## 2019-09-28 MED ORDER — PRAVASTATIN SODIUM 10 MG PO TABS: ORAL_TABLET | ORAL | 1 refills | Status: DC

## 2019-10-26 DIAGNOSIS — I495 Sick sinus syndrome: Secondary | ICD-10-CM | POA: Diagnosis not present

## 2019-10-28 DIAGNOSIS — Z20822 Contact with and (suspected) exposure to covid-19: Secondary | ICD-10-CM | POA: Diagnosis not present

## 2019-10-28 DIAGNOSIS — Z03818 Encounter for observation for suspected exposure to other biological agents ruled out: Secondary | ICD-10-CM | POA: Diagnosis not present

## 2019-11-03 ENCOUNTER — Telehealth: Payer: Self-pay

## 2019-11-03 NOTE — Telephone Encounter (Signed)
Patients daughter called into office stating that her mother is out of town visiting family, she is not able to get any health organization to accept bone density orders that are placed. She will be with her family until December and would like to know if there is a medication she can take. Please advise

## 2019-11-04 NOTE — Telephone Encounter (Signed)
I can send weekly Fosamax or monthly Boniva to pharmacy if they tell me where they want it sent

## 2019-11-05 MED ORDER — IBANDRONATE SODIUM 150 MG PO TABS: 150.0000 mg | ORAL_TABLET | ORAL | 1 refills | Status: DC

## 2019-11-05 NOTE — Telephone Encounter (Signed)
Sent!

## 2019-11-05 NOTE — Telephone Encounter (Signed)
Patients daughter says to send Boniva to [facility] [place]. Her sister will mail the medication to her mother out of town.

## 2019-12-14 DIAGNOSIS — Z23 Encounter for immunization: Secondary | ICD-10-CM | POA: Diagnosis not present

## 2020-01-19 DIAGNOSIS — Z950 Presence of cardiac pacemaker: Secondary | ICD-10-CM | POA: Diagnosis not present

## 2020-01-19 DIAGNOSIS — I495 Sick sinus syndrome: Secondary | ICD-10-CM | POA: Diagnosis not present

## 2020-01-19 DIAGNOSIS — I480 Paroxysmal atrial fibrillation: Secondary | ICD-10-CM | POA: Diagnosis not present

## 2020-02-01 DIAGNOSIS — I495 Sick sinus syndrome: Secondary | ICD-10-CM | POA: Diagnosis not present

## 2020-02-03 ENCOUNTER — Other Ambulatory Visit: Payer: Self-pay

## 2020-02-03 MED ORDER — IBANDRONATE SODIUM 150 MG PO TABS: 150.0000 mg | ORAL_TABLET | ORAL | 1 refills | Status: DC

## 2020-02-07 DIAGNOSIS — L820 Inflamed seborrheic keratosis: Secondary | ICD-10-CM | POA: Diagnosis not present

## 2020-02-07 DIAGNOSIS — C4499 Other specified malignant neoplasm of skin, unspecified: Secondary | ICD-10-CM | POA: Diagnosis not present

## 2020-02-07 DIAGNOSIS — Z859 Personal history of malignant neoplasm, unspecified: Secondary | ICD-10-CM | POA: Diagnosis not present

## 2020-02-07 DIAGNOSIS — D485 Neoplasm of uncertain behavior of skin: Secondary | ICD-10-CM | POA: Diagnosis not present

## 2020-02-07 DIAGNOSIS — C44719 Basal cell carcinoma of skin of left lower limb, including hip: Secondary | ICD-10-CM | POA: Diagnosis not present

## 2020-02-07 DIAGNOSIS — L821 Other seborrheic keratosis: Secondary | ICD-10-CM | POA: Diagnosis not present

## 2020-02-07 DIAGNOSIS — C44319 Basal cell carcinoma of skin of other parts of face: Secondary | ICD-10-CM | POA: Diagnosis not present

## 2020-02-07 DIAGNOSIS — Z85828 Personal history of other malignant neoplasm of skin: Secondary | ICD-10-CM | POA: Diagnosis not present

## 2020-02-07 DIAGNOSIS — L570 Actinic keratosis: Secondary | ICD-10-CM | POA: Diagnosis not present

## 2020-02-28 DIAGNOSIS — H43813 Vitreous degeneration, bilateral: Secondary | ICD-10-CM | POA: Diagnosis not present

## 2020-02-28 DIAGNOSIS — H5213 Myopia, bilateral: Secondary | ICD-10-CM | POA: Diagnosis not present

## 2020-02-28 DIAGNOSIS — H401132 Primary open-angle glaucoma, bilateral, moderate stage: Secondary | ICD-10-CM | POA: Diagnosis not present

## 2020-02-28 DIAGNOSIS — Z961 Presence of intraocular lens: Secondary | ICD-10-CM | POA: Diagnosis not present

## 2020-02-28 DIAGNOSIS — H52223 Regular astigmatism, bilateral: Secondary | ICD-10-CM | POA: Diagnosis not present

## 2020-02-28 DIAGNOSIS — H524 Presbyopia: Secondary | ICD-10-CM | POA: Diagnosis not present

## 2020-03-08 ENCOUNTER — Telehealth: Payer: Self-pay | Admitting: General Practice

## 2020-03-20 ENCOUNTER — Other Ambulatory Visit: Payer: Self-pay

## 2020-03-20 MED ORDER — IBANDRONATE SODIUM 150 MG PO TABS: 150.0000 mg | ORAL_TABLET | ORAL | 3 refills | Status: DC

## 2020-04-07 NOTE — Telephone Encounter (Signed)
Documentation only.

## 2020-05-09 DIAGNOSIS — Z45010 Encounter for checking and testing of cardiac pacemaker pulse generator [battery]: Secondary | ICD-10-CM | POA: Diagnosis not present

## 2020-05-09 DIAGNOSIS — I495 Sick sinus syndrome: Secondary | ICD-10-CM | POA: Diagnosis not present

## 2020-05-09 DIAGNOSIS — Z950 Presence of cardiac pacemaker: Secondary | ICD-10-CM | POA: Diagnosis not present

## 2020-05-17 ENCOUNTER — Other Ambulatory Visit: Payer: Self-pay | Admitting: Osteopathic Medicine

## 2020-05-19 ENCOUNTER — Other Ambulatory Visit: Payer: Self-pay

## 2020-05-19 DIAGNOSIS — E7800 Pure hypercholesterolemia, unspecified: Secondary | ICD-10-CM

## 2020-05-19 MED ORDER — PRAVASTATIN SODIUM 10 MG PO TABS: ORAL_TABLET | ORAL | 1 refills | Status: DC

## 2020-05-27 ENCOUNTER — Other Ambulatory Visit: Payer: Self-pay | Admitting: Osteopathic Medicine

## 2020-05-31 ENCOUNTER — Other Ambulatory Visit: Payer: Self-pay

## 2020-05-31 MED ORDER — OXYBUTYNIN CHLORIDE 5 MG PO TABS: ORAL_TABLET | ORAL | 1 refills | Status: DC

## 2020-06-09 DIAGNOSIS — Z23 Encounter for immunization: Secondary | ICD-10-CM | POA: Diagnosis not present

## 2020-08-09 DIAGNOSIS — Z85828 Personal history of other malignant neoplasm of skin: Secondary | ICD-10-CM | POA: Diagnosis not present

## 2020-08-09 DIAGNOSIS — L821 Other seborrheic keratosis: Secondary | ICD-10-CM | POA: Diagnosis not present

## 2020-08-09 DIAGNOSIS — L570 Actinic keratosis: Secondary | ICD-10-CM | POA: Diagnosis not present

## 2020-08-09 DIAGNOSIS — Z859 Personal history of malignant neoplasm, unspecified: Secondary | ICD-10-CM | POA: Diagnosis not present

## 2020-08-11 ENCOUNTER — Other Ambulatory Visit: Payer: Self-pay

## 2020-08-11 ENCOUNTER — Ambulatory Visit (INDEPENDENT_AMBULATORY_CARE_PROVIDER_SITE_OTHER): Payer: Medicare Other | Admitting: Osteopathic Medicine

## 2020-08-11 VITALS — BP 156/66 | HR 70 | Ht 61.0 in | Wt 121.0 lb

## 2020-08-11 DIAGNOSIS — I4821 Permanent atrial fibrillation: Secondary | ICD-10-CM | POA: Diagnosis not present

## 2020-08-11 DIAGNOSIS — Z1329 Encounter for screening for other suspected endocrine disorder: Secondary | ICD-10-CM | POA: Diagnosis not present

## 2020-08-11 DIAGNOSIS — Z0000 Encounter for general adult medical examination without abnormal findings: Secondary | ICD-10-CM

## 2020-08-11 DIAGNOSIS — E7800 Pure hypercholesterolemia, unspecified: Secondary | ICD-10-CM

## 2020-08-11 DIAGNOSIS — M818 Other osteoporosis without current pathological fracture: Secondary | ICD-10-CM

## 2020-08-11 DIAGNOSIS — M810 Age-related osteoporosis without current pathological fracture: Secondary | ICD-10-CM

## 2020-08-11 DIAGNOSIS — Z79899 Other long term (current) drug therapy: Secondary | ICD-10-CM

## 2020-08-11 DIAGNOSIS — M949 Disorder of cartilage, unspecified: Secondary | ICD-10-CM

## 2020-08-11 DIAGNOSIS — M899 Disorder of bone, unspecified: Secondary | ICD-10-CM | POA: Diagnosis not present

## 2020-08-11 DIAGNOSIS — R030 Elevated blood-pressure reading, without diagnosis of hypertension: Secondary | ICD-10-CM

## 2020-08-11 MED ORDER — PANTOPRAZOLE SODIUM 40 MG PO TBEC: 40.0000 mg | DELAYED_RELEASE_TABLET | Freq: Every day | ORAL | 3 refills | Status: AC

## 2020-08-11 MED ORDER — IBANDRONATE SODIUM 150 MG PO TABS: 150.0000 mg | ORAL_TABLET | ORAL | 3 refills | Status: DC

## 2020-08-11 MED ORDER — PRAVASTATIN SODIUM 10 MG PO TABS: ORAL_TABLET | ORAL | 3 refills | Status: DC

## 2020-08-11 MED ORDER — OXYBUTYNIN CHLORIDE 5 MG PO TABS: ORAL_TABLET | ORAL | 3 refills | Status: DC

## 2020-08-11 NOTE — Progress Notes (Signed)
HPI: Amanda Travis is a 87 y.o. female  who presents to [facility] today, 08/11/20,  for Medicare Annual Wellness Exam  Patient presents for annual physical/Medicare wellness exam. No other  complaints today.   Past medical, surgical, social and family history reviewed:  Patient Active Problem List   Diagnosis Date Noted   Carotid stenosis, asymptomatic, bilateral 11/18/2017   Right-sided Bell's palsy 11/18/2017   Ectropion due to laxity of eyelid, right 11/18/2017   Osteopenia determined by x-ray 06/25/2017   Advance directive discussed with patient 05/28/2016   Onychomycosis 05/28/2016   Elevated blood pressure reading 04/03/2016   Gastric erosions 12/10/2015   Atrial fibrillation ([place]) 12/08/2015   GIB (gastrointestinal bleeding) 12/08/2015   History of cardiac pacemaker 12/08/2015   Microcytic hypochromic anemia 12/08/2015   Intractable cyclical vomiting with nausea 06/23/2015   Gastroesophageal reflux disease with esophagitis 06/23/2015   Gait instability 04/19/2015   Cardiac pacemaker in situ 03/08/2015   Cough 03/08/2015   History of glaucoma 03/08/2015   Palpitations 03/08/2015   Cardiac risk counseling 03/08/2015   History of tachycardia-bradycardia syndrome 03/08/2015   Postmenopausal 03/08/2015   Osteoporosis 03/08/2015   Disorder of bone and cartilage 03/08/2015   Hyperlipidemia 03/08/2015   History of skin cancer 03/08/2015    Past Surgical History:  Procedure Laterality Date   [place]     BLOOD CLOT  2006   [place]     CATARACT   JOINT REPLACEMENT Bilateral    KNEE   MOHS SURGERY  2015   OVARIAN CANCER  1997   TOOTH EXTRACTION      Social History   Socioeconomic History   Marital status: Widowed    Spouse name: Not on file   Number of children: Not on file   Years of education: Not on file   Highest education level: Not on  file  Occupational History   Not on file  Tobacco Use   Smoking status: Never   Smokeless tobacco: Never  Substance and Sexual Activity   Alcohol use: No   Drug use: No   Sexual activity: Never  Other Topics Concern   Not on file  Social History Narrative   Not on file   Social Determinants of Health   Financial Resource Strain: Not on file  Food Insecurity: Not on file  Transportation Needs: Not on file  Physical Activity: Not on file  Stress: Not on file  Social Connections: Not on file  Intimate Partner Violence: Not on file    Family History  Problem Relation Age of Onset   Stroke Mother    Heart disease Sister    Dementia Brother    Heart disease Brother      Current medication list and allergy/intolerance information reviewed:    Outpatient Encounter Medications as of 08/11/2020  Medication Sig   AMBULATORY NON FORMULARY MEDICATION Multi-wheeled walker with seat. Dx: ambulatory dysfunction, fall risk, osteoarthritis. Fax Rx to [phone]   apixaban (ELIQUIS) 2.5 MG TABS tablet Take 2.5 mg by mouth 2 (two) times daily.   ARTIFICIAL TEARS 0.1-0.3 % SOLN Apply to eye as needed.    Calcium Carbonate-Vitamin D 600-400 MG-UNIT chew tablet Chew 2 tablets by mouth daily.   Echinacea 125 MG CAPS Take by mouth. Take 200 mg by mouth daily   Flaxseed, Linseed, (FLAXSEED OIL PO) Take 1,000 mg by mouth. 2 tablets  daily   furosemide (LASIX) 40 MG tablet Take 40 mg by mouth daily.   Garlic 4270 MG TABS Take by mouth. Take 1000 mg by mouth daily   Glucosamine Sulfate 500 MG TABS Take by mouth. Take 200 mg by mouth daily   ibandronate (BONIVA) 150 MG tablet Take 1 tablet (150 mg total) by mouth every 30 (thirty) days. Take in the morning with a full glass of water, on an empty stomach, and do not take anything else by mouth or lie down for the next 30 min.   latanoprost (XALATAN) 0.005 % ophthalmic solution Place 1 drop into both eyes at bedtime.    loratadine (CLARITIN) 10 MG  tablet Take 10 mg by mouth daily.   Lysine 500 MG CAPS Take by mouth. Take 1000 mg by mouth daily   Magnesium Chloride 64 MG TBEC Take 2 tablets (128 mg total) by mouth daily.   Melatonin 5 MG TABS Take by mouth. Take 5 mg by mouth daily   Multiple Vitamin (MULTIVITAMIN) tablet Take 1 tablet by mouth daily.   oxybutynin (DITROPAN) 5 MG tablet TAKE 1 TABLET(5 MG) BY MOUTH TWICE DAILY   pantoprazole (PROTONIX) 40 MG tablet Take 40 mg by mouth daily.    Policosanol 10 MG CAPS Take by mouth. Take by mouth 2 times daily   Potassium 99 MG TABS Take 1 tablet by mouth daily.    pravastatin (PRAVACHOL) 10 MG tablet TAKE 1 TABLET(10 MG) BY MOUTH DAILY   Probiotic Product (PROBIOTIC MULTI-ENZYME PO) Take 80 mg by mouth. 2 tablets daily   pyridOXINE (VITAMIN B-6) 100 MG tablet Take 100 mg by mouth daily.   vitamin B-12 (CYANOCOBALAMIN) 100 MCG tablet Take 100 mcg by mouth daily.   vitamin C (ASCORBIC ACID) 500 MG tablet Take 500 mg by mouth daily.   [DISCONTINUED] dorzolamide-timolol (COSOPT) 22.3-6.8 MG/ML ophthalmic solution Place 1 drop into both eyes 2 (two) times daily.    [DISCONTINUED] PROLIA 60 MG/ML SOSY injection Inject 60 mg into the skin every 6 (six) months.    No facility-administered encounter medications on file as of 08/11/2020.    Allergies  Allergen Reactions   Cephalexin Diarrhea and Other (See Comments)   Valacyclovir Hcl Other (See Comments)   Atorvastatin Diarrhea   Tetracyclines & Related Diarrhea   Valacyclovir Diarrhea       Review of Systems: Review of Systems - General ROS: negative   Medicare Wellness Questionnaire  Are there smokers in your home (other than you)? no  Depression Screen (Note: if answer to either of the following is "Yes", a more complete depression screening is indicated)   Q1: Over the past two weeks, have you felt down, depressed or hopeless? no  Q2: Over the past two weeks, have you felt little interest or pleasure in doing things?  no  Have you lost interest or pleasure in daily life? no  Do you often feel hopeless? no  Do you cry easily over simple problems? no  Activities of Daily Living In your present state of health, do you have any difficulty performing the following activities?:  Driving? DOES NOT DRIVE Managing money?  no Feeding yourself? no Getting from bed to chair? no Climbing a flight of stairs? DEPENDS - FEW STEPS ARE FINE, LONG FLIGHTS ARE A PROBLEM  Preparing food and eating?: no Bathing or showering? no Getting dressed: no Getting to the toilet? no Using the toilet: no Moving around from place to place: no In the past year have  you fallen or had a near fall?: no  Hearing Difficulties:  Do you often ask people to speak up or repeat themselves? yes Do you experience ringing or noises in your ears? no  Do you have difficulty understanding soft or whispered voices? yes  Memory Difficulties:  Do you feel that you have a problem with memory? no  Do you often misplace items? no  Do you feel safe at home?  yes  Sexual Health:   Are you sexually active?  No  Do you have more than one partner?  No  Advanced Directives:   Advanced directives discussed: DNI/DNR  Additional information provided: no  Risk Factors  Current exercise habits: active as able  Dietary issues discussed:no concerns  Cardiac risk factors: known cardiac disease, positive family history   Exam:  BP (!) 156/66   Pulse 70   Ht 5\' 1"  (1.549 m)   Wt 121 lb (54.9 kg)   SpO2 96%   BMI 22.86 kg/m  Vision by Snellen chart: right eye:see nurse notes, left eye:see nurse notes Constitutional: VS see above. General Appearance: alert, well-developed, well-nourished, NAD Ears, Nose, Mouth, Throat: MMM Neck: No masses, trachea midline.  Respiratory: Normal respiratory effort. no wheeze, no rhonchi, no rales Cardiovascular:No lower extremity edema.  Musculoskeletal: Gait normal. No clubbing/cyanosis of digits.  Neurological:  Normal balance/coordination. No tremor. Recalls 3 objects and able to read face of watch with correct time.  Skin: warm, dry, intact. No rash/ulcer.  Psychiatric: Normal judgment/insight. Normal mood and affect. Oriented x3.     ASSESSMENT/PLAN:   Encounter for Medicare annual wellness exam  Pure hypercholesterolemia  Elevated blood pressure reading  Annual physical exam  Thyroid disorder screen  CANCER SCREENING Lung - USPSTF: 55-80yo w/ 30 py hx unless quit w/in 63yr - does not need Colon - does not need Prostate - does not need Breast - does not need Cervical - does not need OTHER DISEASE SCREENING Lipid - needs DM2 - needs AAA - female 52-75yo ever smoked - does not need Osteoporosis - women 85yo+, men 85yo+ - needs INFECTIOUS DISEASE SCREENING HIV - does not need GC/CT - does not need HepC -does not need TB - does not need ADULT VACCINATION Influenza - annual vaccine recommended Td - booster every 10 years - does not need Zoster - option at 45, yes at 68+ - does not need PCV13 - does not need PPSV23 - does not need Immunization History  Administered Date(s) Administered   Influenza Split 12/08/2014, 12/12/2015   Influenza, High Dose Seasonal PF 11/26/2016, 11/18/2017   Influenza, Seasonal, Injecte, Preservative Fre 12/08/2014, 12/12/2015, 11/26/2016, 11/18/2017   Influenza-Unspecified 12/08/2014, 12/12/2015, 11/26/2016, 11/18/2017, 12/18/2018   Janssen (J&J) SARS-COV-2 Vaccination 05/02/2019   Moderna Sars-Covid-2 Vaccination 02/20/2020   PFIZER(Purple Top)SARS-COV-2 Vaccination 06/18/2020   Pneumococcal Conjugate-13 10/19/2013   Pneumococcal Polysaccharide-23 12/08/2014   Tdap 04/05/2015   Zoster Recombinat (Shingrix) 05/30/2016, 08/05/2016   Zoster, Live 10/21/2013   OTHER Fall - exercise and Vit D age 80+ - does not need Advanced Directives -  Discussed as above   During the course of the visit the patient was educated and counseled about appropriate  screening and preventive services as noted above.   Patient Instructions (the written plan) was given to the patient.  Medicare Attestation I have personally reviewed: The patient's medical and social history Their use of alcohol, tobacco or illicit drugs Their current medications and supplements The patient's functional ability including ADLs,fall risks, home safety risks, cognitive, and  hearing and visual impairment Diet and physical activities Evidence for depression or mood disorders  The patient's weight, height, BMI, and visual acuity have been recorded in the chart.  I have made referrals, counseling, and provided education to the patient based on review of the above and I have provided the patient with a written personalized care plan for preventive services.     Emeterio Reeve, DO   08/11/20   Visit summary with medication list and pertinent instructions was printed for patient to review. All questions at time of visit were answered - patient instructed to contact office with any additional concerns. ER/RTC precautions were reviewed with the patient. Follow-up plan: No follow-ups on file.

## 2020-08-11 NOTE — Patient Instructions (Signed)
  Ms. Gilkey , Thank you for taking time to come for your Medicare Wellness Visit. I appreciate your ongoing commitment to your health goals. Please review the following plan we discussed and let me know if I can assist you in the future.   Labs today Repeat bone density test ordered, please call [phone] to schedule    This is a list of the screening recommended for you and due dates:  Health Maintenance  Topic Date Due   Flu Shot  09/18/2020   COVID-19 Vaccine (4 - Booster for Janssen series) 10/19/2020   Tetanus Vaccine  04/04/2025   DEXA scan (bone density measurement)  Every 2-3 years    Pneumonia vaccines  Completed   Zoster (Shingles) Vaccine  Completed   HPV Vaccine  Aged Out

## 2020-08-12 LAB — CBC WITH DIFFERENTIAL/PLATELET
Absolute Monocytes: 515 cells/uL (ref 200–950)
Basophils Absolute: 33 cells/uL (ref 0–200)
Basophils Relative: 0.5 %
Eosinophils Absolute: 79 cells/uL (ref 15–500)
Eosinophils Relative: 1.2 %
HCT: 42.5 % (ref 35.0–45.0)
Hemoglobin: 13.9 g/dL (ref 11.7–15.5)
Lymphs Abs: 785 cells/uL — ABNORMAL LOW (ref 850–3900)
MCH: 30.3 pg (ref 27.0–33.0)
MCHC: 32.7 g/dL (ref 32.0–36.0)
MCV: 92.8 fL (ref 80.0–100.0)
MPV: 9.3 fL (ref 7.5–12.5)
Monocytes Relative: 7.8 %
Neutro Abs: 5188 cells/uL (ref 1500–7800)
Neutrophils Relative %: 78.6 %
Platelets: 206 10*3/uL (ref 140–400)
RBC: 4.58 10*6/uL (ref 3.80–5.10)
RDW: 13.1 % (ref 11.0–15.0)
Total Lymphocyte: 11.9 %
WBC: 6.6 10*3/uL (ref 3.8–10.8)

## 2020-08-12 LAB — COMPLETE METABOLIC PANEL WITH GFR
AG Ratio: 1.4 (calc) (ref 1.0–2.5)
ALT: 10 U/L (ref 6–29)
AST: 17 U/L (ref 10–35)
Albumin: 3.9 g/dL (ref 3.6–5.1)
Alkaline phosphatase (APISO): 53 U/L (ref 37–153)
BUN/Creatinine Ratio: 25 (calc) — ABNORMAL HIGH (ref 6–22)
BUN: 25 mg/dL (ref 7–25)
CO2: 32 mmol/L (ref 20–32)
Calcium: 9.4 mg/dL (ref 8.6–10.4)
Chloride: 102 mmol/L (ref 98–110)
Creat: 1.01 mg/dL — ABNORMAL HIGH (ref 0.60–0.88)
GFR, Est African American: 57 mL/min/{1.73_m2} — ABNORMAL LOW (ref >=60)
GFR, Est Non African American: 49 mL/min/{1.73_m2} — ABNORMAL LOW (ref >=60)
Globulin: 2.8 g/dL (calc) (ref 1.9–3.7)
Glucose, Bld: 116 mg/dL — ABNORMAL HIGH (ref 65–99)
Potassium: 4.1 mmol/L (ref 3.5–5.3)
Sodium: 143 mmol/L (ref 135–146)
Total Bilirubin: 0.7 mg/dL (ref 0.2–1.2)
Total Protein: 6.7 g/dL (ref 6.1–8.1)

## 2020-08-12 LAB — LIPID PANEL W/REFLEX DIRECT LDL
Cholesterol: 182 mg/dL (ref <200)
HDL: 69 mg/dL (ref >=50)
LDL Cholesterol (Calc): 91 mg/dL (calc)
Non-HDL Cholesterol (Calc): 113 mg/dL (calc) (ref <130)
Total CHOL/HDL Ratio: 2.6 (calc) (ref <5.0)
Triglycerides: 119 mg/dL (ref <150)

## 2020-08-12 LAB — VITAMIN B12: Vitamin B-12: 1564 pg/mL — ABNORMAL HIGH (ref 200–1100)

## 2020-08-12 LAB — TSH: TSH: 2.69 mIU/L (ref 0.40–4.50)

## 2020-08-15 DIAGNOSIS — I495 Sick sinus syndrome: Secondary | ICD-10-CM | POA: Diagnosis not present

## 2020-08-15 DIAGNOSIS — I480 Paroxysmal atrial fibrillation: Secondary | ICD-10-CM | POA: Diagnosis not present

## 2020-08-28 DIAGNOSIS — H401132 Primary open-angle glaucoma, bilateral, moderate stage: Secondary | ICD-10-CM | POA: Diagnosis not present

## 2020-09-06 ENCOUNTER — Other Ambulatory Visit: Payer: Self-pay

## 2020-09-06 ENCOUNTER — Ambulatory Visit (INDEPENDENT_AMBULATORY_CARE_PROVIDER_SITE_OTHER): Payer: Medicare Other

## 2020-09-06 DIAGNOSIS — M85851 Other specified disorders of bone density and structure, right thigh: Secondary | ICD-10-CM | POA: Diagnosis not present

## 2020-09-06 DIAGNOSIS — M818 Other osteoporosis without current pathological fracture: Secondary | ICD-10-CM | POA: Diagnosis not present

## 2020-09-06 DIAGNOSIS — Z780 Asymptomatic menopausal state: Secondary | ICD-10-CM | POA: Diagnosis not present

## 2020-11-21 DIAGNOSIS — I495 Sick sinus syndrome: Secondary | ICD-10-CM | POA: Diagnosis not present

## 2020-11-26 DIAGNOSIS — Z23 Encounter for immunization: Secondary | ICD-10-CM | POA: Diagnosis not present

## 2021-01-22 DIAGNOSIS — C44311 Basal cell carcinoma of skin of nose: Secondary | ICD-10-CM | POA: Diagnosis not present

## 2021-01-22 DIAGNOSIS — D485 Neoplasm of uncertain behavior of skin: Secondary | ICD-10-CM | POA: Diagnosis not present

## 2021-02-09 ENCOUNTER — Other Ambulatory Visit: Payer: Self-pay

## 2021-02-09 ENCOUNTER — Encounter: Payer: Self-pay | Admitting: Medical-Surgical

## 2021-02-09 ENCOUNTER — Ambulatory Visit (INDEPENDENT_AMBULATORY_CARE_PROVIDER_SITE_OTHER): Payer: Medicare Other | Admitting: Medical-Surgical

## 2021-02-09 ENCOUNTER — Telehealth: Payer: Self-pay | Admitting: Medical-Surgical

## 2021-02-09 VITALS — BP 130/73 | HR 73 | Resp 20 | Ht 61.0 in

## 2021-02-09 DIAGNOSIS — E7800 Pure hypercholesterolemia, unspecified: Secondary | ICD-10-CM | POA: Diagnosis not present

## 2021-02-09 DIAGNOSIS — K2100 Gastro-esophageal reflux disease with esophagitis, without bleeding: Secondary | ICD-10-CM | POA: Diagnosis not present

## 2021-02-09 DIAGNOSIS — R54 Age-related physical debility: Secondary | ICD-10-CM | POA: Diagnosis not present

## 2021-02-09 DIAGNOSIS — Z7689 Persons encountering health services in other specified circumstances: Secondary | ICD-10-CM

## 2021-02-09 DIAGNOSIS — M818 Other osteoporosis without current pathological fracture: Secondary | ICD-10-CM | POA: Diagnosis not present

## 2021-02-09 DIAGNOSIS — R410 Disorientation, unspecified: Secondary | ICD-10-CM

## 2021-02-09 DIAGNOSIS — I4821 Permanent atrial fibrillation: Secondary | ICD-10-CM | POA: Diagnosis not present

## 2021-02-09 DIAGNOSIS — R030 Elevated blood-pressure reading, without diagnosis of hypertension: Secondary | ICD-10-CM | POA: Diagnosis not present

## 2021-02-09 MED ORDER — HYDROCORTISONE-ACETIC ACID 1-2 % OT SOLN: 3.0000 [drp] | Freq: Two times a day (BID) | OTIC | 0 refills | Status: AC

## 2021-02-09 NOTE — Progress Notes (Signed)
°  HPI with pertinent ROS:   CC: transfer of care  HPI: Very pleasant 87 year old female presenting today to transfer care to a new PCP.  She is accompanied by her daughter who helps serve as historian.  In September and November, she had a trip to [place] to visit her other daughter.  During this time, she had some confusion, specifically after her return from the trip.  This was transient and has since resolved but was very concerning.  Ambulates slowly using a rolling walker.  Often has increased respiratory effort with walking or other activities.  Feels that she gets out of breath easily.  Wears bilateral hearing aids.  Over the past few days has noted that her ear canals are very itchy.  She does not have any ear pain or changes in hearing.  Admits to using rubbing alcohol and Q-tips to clean her ears.  I reviewed the past medical history, family history, social history, surgical history, and allergies today and no changes were needed.  Please see the problem list section below in epic for further details.   Physical exam:   General: Well Developed, well nourished, and in no acute distress.  Neuro: Alert and oriented x3.  HEENT: Normocephalic, atraumatic.  Bilateral external ear canals patent, dry, no cerumen or exudate noted.  TMs normal bilaterally. Skin: Warm and dry. Cardiac: Regular rate and rhythm, no murmurs rubs or gallops, no lower extremity edema.  Respiratory: Clear to auscultation bilaterally. Not using accessory muscles, speaking in full sentences.  Impression and Recommendations:    1. Encounter to establish care Reviewed available information and discussed care concerns with patient.   2. Permanent atrial fibrillation ([place]) Followed by cardiology.  Continue Eliquis as prescribed.  3. Gastroesophageal reflux disease with esophagitis without hemorrhage Doing well on pantoprazole 40 mg daily.  Continue medication as prescribed.  4. Other osteoporosis,  unspecified pathological fracture presence Continue Boniva 150 mg as prescribed.  5. Pure hypercholesterolemia Continue Pravachol 10 mg daily.  6. Elevated blood pressure reading Blood pressure looks good today.  Continue as needed Lasix.  7. Transient confusion Confusion has resolved overall but could possibly be a result of UTI.  Urinalysis ordered and patient provided with collection cup so they can collect this at home and return to our lab for evaluation. - Urinalysis, Routine w reflex microscopic  8. Age-related physical debility Referral to home health placed per patient and daughter request. - Ambulatory referral to [facility]  Return in about 3 months (around 05/10/2021) for chronic disease follow up. ___________________________________________ Clearnce Sorrel, DNP, APRN, FNP-BC [facility] and [place]

## 2021-02-09 NOTE — Telephone Encounter (Signed)
Baird Lyons from [facility] called and said that they can complete this patients start of care on Tuesday 12/27.  Is this acceptable?  cindy

## 2021-02-09 NOTE — Telephone Encounter (Signed)
Yes, this is fine.

## 2021-02-13 ENCOUNTER — Telehealth: Payer: Self-pay | Admitting: *Deleted

## 2021-02-13 ENCOUNTER — Telehealth: Payer: Self-pay

## 2021-02-13 DIAGNOSIS — E7800 Pure hypercholesterolemia, unspecified: Secondary | ICD-10-CM | POA: Diagnosis not present

## 2021-02-13 DIAGNOSIS — Z950 Presence of cardiac pacemaker: Secondary | ICD-10-CM | POA: Diagnosis not present

## 2021-02-13 DIAGNOSIS — Z9181 History of falling: Secondary | ICD-10-CM | POA: Diagnosis not present

## 2021-02-13 DIAGNOSIS — Z7901 Long term (current) use of anticoagulants: Secondary | ICD-10-CM | POA: Diagnosis not present

## 2021-02-13 DIAGNOSIS — Z7983 Long term (current) use of bisphosphonates: Secondary | ICD-10-CM | POA: Diagnosis not present

## 2021-02-13 DIAGNOSIS — R030 Elevated blood-pressure reading, without diagnosis of hypertension: Secondary | ICD-10-CM | POA: Diagnosis not present

## 2021-02-13 DIAGNOSIS — I4821 Permanent atrial fibrillation: Secondary | ICD-10-CM | POA: Diagnosis not present

## 2021-02-13 DIAGNOSIS — K2100 Gastro-esophageal reflux disease with esophagitis, without bleeding: Secondary | ICD-10-CM | POA: Diagnosis not present

## 2021-02-13 DIAGNOSIS — M810 Age-related osteoporosis without current pathological fracture: Secondary | ICD-10-CM | POA: Diagnosis not present

## 2021-02-13 DIAGNOSIS — R410 Disorientation, unspecified: Secondary | ICD-10-CM | POA: Diagnosis not present

## 2021-02-13 NOTE — Telephone Encounter (Signed)
Medication: acetic acid-hydrocortisone (VOSOL-HC) OTIC solution Prior authorization submitted via CoverMyMeds on 02/13/2021 PA submission pending

## 2021-02-13 NOTE — Telephone Encounter (Signed)
Noted! Thank you

## 2021-02-13 NOTE — Telephone Encounter (Signed)
VO given to continue PT for twice a week for 4 weeks, then once weekly for 15 weeks.

## 2021-02-13 NOTE — Telephone Encounter (Signed)
Medication: acetic acid-hydrocortisone (VOSOL-HC) OTIC solution Prior authorization determination received Medication has been approved Approval dates: 02/13/2021-02/13/2022  Patient aware via: phone Pharmacy aware: Yes Provider aware via this encounter

## 2021-02-15 DIAGNOSIS — R410 Disorientation, unspecified: Secondary | ICD-10-CM | POA: Diagnosis not present

## 2021-02-16 DIAGNOSIS — M810 Age-related osteoporosis without current pathological fracture: Secondary | ICD-10-CM | POA: Diagnosis not present

## 2021-02-16 DIAGNOSIS — R410 Disorientation, unspecified: Secondary | ICD-10-CM | POA: Diagnosis not present

## 2021-02-16 DIAGNOSIS — E7800 Pure hypercholesterolemia, unspecified: Secondary | ICD-10-CM | POA: Diagnosis not present

## 2021-02-16 DIAGNOSIS — K2100 Gastro-esophageal reflux disease with esophagitis, without bleeding: Secondary | ICD-10-CM | POA: Diagnosis not present

## 2021-02-16 DIAGNOSIS — I4821 Permanent atrial fibrillation: Secondary | ICD-10-CM | POA: Diagnosis not present

## 2021-02-16 DIAGNOSIS — R030 Elevated blood-pressure reading, without diagnosis of hypertension: Secondary | ICD-10-CM | POA: Diagnosis not present

## 2021-02-16 LAB — URINALYSIS, ROUTINE W REFLEX MICROSCOPIC
Bilirubin Urine: NEGATIVE
Glucose, UA: NEGATIVE
Hgb urine dipstick: NEGATIVE
Ketones, ur: NEGATIVE
Leukocytes,Ua: NEGATIVE
Nitrite: NEGATIVE
Protein, ur: NEGATIVE
Specific Gravity, Urine: 1.014 (ref 1.001–1.035)
pH: 7 (ref 5.0–8.0)

## 2021-02-20 DIAGNOSIS — R030 Elevated blood-pressure reading, without diagnosis of hypertension: Secondary | ICD-10-CM | POA: Diagnosis not present

## 2021-02-20 DIAGNOSIS — I4821 Permanent atrial fibrillation: Secondary | ICD-10-CM | POA: Diagnosis not present

## 2021-02-20 DIAGNOSIS — M810 Age-related osteoporosis without current pathological fracture: Secondary | ICD-10-CM | POA: Diagnosis not present

## 2021-02-20 DIAGNOSIS — E7800 Pure hypercholesterolemia, unspecified: Secondary | ICD-10-CM | POA: Diagnosis not present

## 2021-02-20 DIAGNOSIS — K2100 Gastro-esophageal reflux disease with esophagitis, without bleeding: Secondary | ICD-10-CM | POA: Diagnosis not present

## 2021-02-20 DIAGNOSIS — R410 Disorientation, unspecified: Secondary | ICD-10-CM | POA: Diagnosis not present

## 2021-02-22 DIAGNOSIS — I4821 Permanent atrial fibrillation: Secondary | ICD-10-CM | POA: Diagnosis not present

## 2021-02-22 DIAGNOSIS — R410 Disorientation, unspecified: Secondary | ICD-10-CM | POA: Diagnosis not present

## 2021-02-22 DIAGNOSIS — E7800 Pure hypercholesterolemia, unspecified: Secondary | ICD-10-CM | POA: Diagnosis not present

## 2021-02-22 DIAGNOSIS — M810 Age-related osteoporosis without current pathological fracture: Secondary | ICD-10-CM | POA: Diagnosis not present

## 2021-02-22 DIAGNOSIS — K2100 Gastro-esophageal reflux disease with esophagitis, without bleeding: Secondary | ICD-10-CM | POA: Diagnosis not present

## 2021-02-22 DIAGNOSIS — R030 Elevated blood-pressure reading, without diagnosis of hypertension: Secondary | ICD-10-CM | POA: Diagnosis not present

## 2021-02-27 DIAGNOSIS — E7800 Pure hypercholesterolemia, unspecified: Secondary | ICD-10-CM | POA: Diagnosis not present

## 2021-02-27 DIAGNOSIS — M810 Age-related osteoporosis without current pathological fracture: Secondary | ICD-10-CM | POA: Diagnosis not present

## 2021-02-27 DIAGNOSIS — I4821 Permanent atrial fibrillation: Secondary | ICD-10-CM | POA: Diagnosis not present

## 2021-02-27 DIAGNOSIS — R030 Elevated blood-pressure reading, without diagnosis of hypertension: Secondary | ICD-10-CM | POA: Diagnosis not present

## 2021-02-27 DIAGNOSIS — K2100 Gastro-esophageal reflux disease with esophagitis, without bleeding: Secondary | ICD-10-CM | POA: Diagnosis not present

## 2021-02-27 DIAGNOSIS — I495 Sick sinus syndrome: Secondary | ICD-10-CM | POA: Diagnosis not present

## 2021-02-27 DIAGNOSIS — R410 Disorientation, unspecified: Secondary | ICD-10-CM | POA: Diagnosis not present

## 2021-03-01 DIAGNOSIS — M810 Age-related osteoporosis without current pathological fracture: Secondary | ICD-10-CM | POA: Diagnosis not present

## 2021-03-01 DIAGNOSIS — K2100 Gastro-esophageal reflux disease with esophagitis, without bleeding: Secondary | ICD-10-CM | POA: Diagnosis not present

## 2021-03-01 DIAGNOSIS — R410 Disorientation, unspecified: Secondary | ICD-10-CM | POA: Diagnosis not present

## 2021-03-01 DIAGNOSIS — I4821 Permanent atrial fibrillation: Secondary | ICD-10-CM | POA: Diagnosis not present

## 2021-03-01 DIAGNOSIS — R030 Elevated blood-pressure reading, without diagnosis of hypertension: Secondary | ICD-10-CM | POA: Diagnosis not present

## 2021-03-01 DIAGNOSIS — E7800 Pure hypercholesterolemia, unspecified: Secondary | ICD-10-CM | POA: Diagnosis not present

## 2021-03-05 DIAGNOSIS — H5213 Myopia, bilateral: Secondary | ICD-10-CM | POA: Diagnosis not present

## 2021-03-05 DIAGNOSIS — H43813 Vitreous degeneration, bilateral: Secondary | ICD-10-CM | POA: Diagnosis not present

## 2021-03-05 DIAGNOSIS — Z961 Presence of intraocular lens: Secondary | ICD-10-CM | POA: Diagnosis not present

## 2021-03-05 DIAGNOSIS — H52223 Regular astigmatism, bilateral: Secondary | ICD-10-CM | POA: Diagnosis not present

## 2021-03-05 DIAGNOSIS — H524 Presbyopia: Secondary | ICD-10-CM | POA: Diagnosis not present

## 2021-03-05 DIAGNOSIS — H401132 Primary open-angle glaucoma, bilateral, moderate stage: Secondary | ICD-10-CM | POA: Diagnosis not present

## 2021-03-08 DIAGNOSIS — R030 Elevated blood-pressure reading, without diagnosis of hypertension: Secondary | ICD-10-CM | POA: Diagnosis not present

## 2021-03-08 DIAGNOSIS — E7800 Pure hypercholesterolemia, unspecified: Secondary | ICD-10-CM | POA: Diagnosis not present

## 2021-03-08 DIAGNOSIS — K2100 Gastro-esophageal reflux disease with esophagitis, without bleeding: Secondary | ICD-10-CM | POA: Diagnosis not present

## 2021-03-08 DIAGNOSIS — M810 Age-related osteoporosis without current pathological fracture: Secondary | ICD-10-CM | POA: Diagnosis not present

## 2021-03-08 DIAGNOSIS — R410 Disorientation, unspecified: Secondary | ICD-10-CM | POA: Diagnosis not present

## 2021-03-08 DIAGNOSIS — I4821 Permanent atrial fibrillation: Secondary | ICD-10-CM | POA: Diagnosis not present

## 2021-03-09 DIAGNOSIS — M810 Age-related osteoporosis without current pathological fracture: Secondary | ICD-10-CM | POA: Diagnosis not present

## 2021-03-09 DIAGNOSIS — R030 Elevated blood-pressure reading, without diagnosis of hypertension: Secondary | ICD-10-CM | POA: Diagnosis not present

## 2021-03-09 DIAGNOSIS — I4821 Permanent atrial fibrillation: Secondary | ICD-10-CM | POA: Diagnosis not present

## 2021-03-09 DIAGNOSIS — R410 Disorientation, unspecified: Secondary | ICD-10-CM | POA: Diagnosis not present

## 2021-03-09 DIAGNOSIS — K2100 Gastro-esophageal reflux disease with esophagitis, without bleeding: Secondary | ICD-10-CM | POA: Diagnosis not present

## 2021-03-09 DIAGNOSIS — E7800 Pure hypercholesterolemia, unspecified: Secondary | ICD-10-CM | POA: Diagnosis not present

## 2021-03-12 ENCOUNTER — Telehealth: Payer: Self-pay

## 2021-03-12 NOTE — Telephone Encounter (Signed)
Amanda Travis called stating that the pt's memory loss episodes seem to be more frequent. They are scheduled for a f/up on 05/14/2021 but would like to know if there is any testing or anything that can be done prior to the upcoming appointment.

## 2021-03-13 DIAGNOSIS — M810 Age-related osteoporosis without current pathological fracture: Secondary | ICD-10-CM | POA: Diagnosis not present

## 2021-03-13 DIAGNOSIS — I4821 Permanent atrial fibrillation: Secondary | ICD-10-CM | POA: Diagnosis not present

## 2021-03-13 DIAGNOSIS — E7800 Pure hypercholesterolemia, unspecified: Secondary | ICD-10-CM | POA: Diagnosis not present

## 2021-03-13 DIAGNOSIS — K2100 Gastro-esophageal reflux disease with esophagitis, without bleeding: Secondary | ICD-10-CM | POA: Diagnosis not present

## 2021-03-13 DIAGNOSIS — R410 Disorientation, unspecified: Secondary | ICD-10-CM | POA: Diagnosis not present

## 2021-03-13 DIAGNOSIS — R030 Elevated blood-pressure reading, without diagnosis of hypertension: Secondary | ICD-10-CM | POA: Diagnosis not present

## 2021-03-15 DIAGNOSIS — Z7983 Long term (current) use of bisphosphonates: Secondary | ICD-10-CM | POA: Diagnosis not present

## 2021-03-15 DIAGNOSIS — Z7901 Long term (current) use of anticoagulants: Secondary | ICD-10-CM | POA: Diagnosis not present

## 2021-03-15 DIAGNOSIS — R410 Disorientation, unspecified: Secondary | ICD-10-CM | POA: Diagnosis not present

## 2021-03-15 DIAGNOSIS — K2100 Gastro-esophageal reflux disease with esophagitis, without bleeding: Secondary | ICD-10-CM | POA: Diagnosis not present

## 2021-03-15 DIAGNOSIS — M810 Age-related osteoporosis without current pathological fracture: Secondary | ICD-10-CM | POA: Diagnosis not present

## 2021-03-15 DIAGNOSIS — Z950 Presence of cardiac pacemaker: Secondary | ICD-10-CM | POA: Diagnosis not present

## 2021-03-15 DIAGNOSIS — R030 Elevated blood-pressure reading, without diagnosis of hypertension: Secondary | ICD-10-CM | POA: Diagnosis not present

## 2021-03-15 DIAGNOSIS — I4821 Permanent atrial fibrillation: Secondary | ICD-10-CM | POA: Diagnosis not present

## 2021-03-15 DIAGNOSIS — Z9181 History of falling: Secondary | ICD-10-CM | POA: Diagnosis not present

## 2021-03-15 DIAGNOSIS — E7800 Pure hypercholesterolemia, unspecified: Secondary | ICD-10-CM | POA: Diagnosis not present

## 2021-03-21 DIAGNOSIS — I4821 Permanent atrial fibrillation: Secondary | ICD-10-CM | POA: Diagnosis not present

## 2021-03-21 DIAGNOSIS — E7800 Pure hypercholesterolemia, unspecified: Secondary | ICD-10-CM | POA: Diagnosis not present

## 2021-03-21 DIAGNOSIS — K2100 Gastro-esophageal reflux disease with esophagitis, without bleeding: Secondary | ICD-10-CM | POA: Diagnosis not present

## 2021-03-21 DIAGNOSIS — M810 Age-related osteoporosis without current pathological fracture: Secondary | ICD-10-CM | POA: Diagnosis not present

## 2021-03-21 DIAGNOSIS — R030 Elevated blood-pressure reading, without diagnosis of hypertension: Secondary | ICD-10-CM | POA: Diagnosis not present

## 2021-03-21 DIAGNOSIS — R410 Disorientation, unspecified: Secondary | ICD-10-CM | POA: Diagnosis not present

## 2021-03-22 DIAGNOSIS — C44311 Basal cell carcinoma of skin of nose: Secondary | ICD-10-CM | POA: Diagnosis not present

## 2021-03-26 ENCOUNTER — Other Ambulatory Visit: Payer: Self-pay

## 2021-03-26 ENCOUNTER — Encounter: Payer: Self-pay | Admitting: Medical-Surgical

## 2021-03-26 ENCOUNTER — Ambulatory Visit (INDEPENDENT_AMBULATORY_CARE_PROVIDER_SITE_OTHER): Payer: Medicare Other | Admitting: Medical-Surgical

## 2021-03-26 VITALS — BP 130/76 | HR 60 | Resp 20 | Ht 61.0 in | Wt 116.0 lb

## 2021-03-26 DIAGNOSIS — R413 Other amnesia: Secondary | ICD-10-CM

## 2021-03-26 NOTE — Progress Notes (Signed)
°  HPI with pertinent ROS:   CC: Continued memory issues  HPI: Pleasant 88 year old female accompanied by her daughter presenting today for continued concerns with memory issues.  She has been having difficulty with short-term memory periodically.  This occasionally happens and is most often associated with occurring in the evening hours.  Her biggest episode of confusion was about 2 weeks ago where she thought that she had 2 walkers rather than wanting kept asking for the larger 1.  She is also not been sleeping well and will only sleep a couple of hours before having to get up to go to the bathroom.  Her family is aware that some of the memory issues are to be expected but would still like to pursue further investigation with a specialist.  Denies mood swings, behavioral disturbances, dysuria, lethargy, and cough.  I reviewed the past medical history, family history, social history, surgical history, and allergies today and no changes were needed.  Please see the problem list section below in epic for further details.   Physical exam:   General: Well Developed, well nourished, and in no acute distress.  Neuro: Alert and oriented x3.  HEENT: Normocephalic, atraumatic.  Skin: Warm and dry. Cardiac: Regular rate and rhythm.  Respiratory: Not using accessory muscles, speaking in full sentences.  Impression and Recommendations:    1. Memory disturbance Reviewed expectation that some memory loss is normal at the age of 34 however concerns remain that there may be something else going on.  Reviewed options for medical treatment including Namenda versus Aricept.  Agree with family that they would like to seek further testing prior to initiating a medication.  Referral to neurology placed per family request.  Return if symptoms worsen or fail to improve. ___________________________________________ Clearnce Sorrel, DNP, APRN, FNP-BC [facility] and [place]

## 2021-03-28 DIAGNOSIS — K2100 Gastro-esophageal reflux disease with esophagitis, without bleeding: Secondary | ICD-10-CM | POA: Diagnosis not present

## 2021-03-28 DIAGNOSIS — I4821 Permanent atrial fibrillation: Secondary | ICD-10-CM | POA: Diagnosis not present

## 2021-03-28 DIAGNOSIS — M810 Age-related osteoporosis without current pathological fracture: Secondary | ICD-10-CM | POA: Diagnosis not present

## 2021-03-28 DIAGNOSIS — R410 Disorientation, unspecified: Secondary | ICD-10-CM | POA: Diagnosis not present

## 2021-03-28 DIAGNOSIS — R030 Elevated blood-pressure reading, without diagnosis of hypertension: Secondary | ICD-10-CM | POA: Diagnosis not present

## 2021-03-28 DIAGNOSIS — E7800 Pure hypercholesterolemia, unspecified: Secondary | ICD-10-CM | POA: Diagnosis not present

## 2021-04-04 DIAGNOSIS — R410 Disorientation, unspecified: Secondary | ICD-10-CM | POA: Diagnosis not present

## 2021-04-04 DIAGNOSIS — K2100 Gastro-esophageal reflux disease with esophagitis, without bleeding: Secondary | ICD-10-CM | POA: Diagnosis not present

## 2021-04-04 DIAGNOSIS — R030 Elevated blood-pressure reading, without diagnosis of hypertension: Secondary | ICD-10-CM | POA: Diagnosis not present

## 2021-04-04 DIAGNOSIS — M810 Age-related osteoporosis without current pathological fracture: Secondary | ICD-10-CM | POA: Diagnosis not present

## 2021-04-04 DIAGNOSIS — I4821 Permanent atrial fibrillation: Secondary | ICD-10-CM | POA: Diagnosis not present

## 2021-04-04 DIAGNOSIS — E7800 Pure hypercholesterolemia, unspecified: Secondary | ICD-10-CM | POA: Diagnosis not present

## 2021-04-10 DIAGNOSIS — R030 Elevated blood-pressure reading, without diagnosis of hypertension: Secondary | ICD-10-CM | POA: Diagnosis not present

## 2021-04-10 DIAGNOSIS — M810 Age-related osteoporosis without current pathological fracture: Secondary | ICD-10-CM | POA: Diagnosis not present

## 2021-04-10 DIAGNOSIS — K2100 Gastro-esophageal reflux disease with esophagitis, without bleeding: Secondary | ICD-10-CM | POA: Diagnosis not present

## 2021-04-10 DIAGNOSIS — E7800 Pure hypercholesterolemia, unspecified: Secondary | ICD-10-CM | POA: Diagnosis not present

## 2021-04-10 DIAGNOSIS — I4821 Permanent atrial fibrillation: Secondary | ICD-10-CM | POA: Diagnosis not present

## 2021-04-10 DIAGNOSIS — R410 Disorientation, unspecified: Secondary | ICD-10-CM | POA: Diagnosis not present

## 2021-04-19 ENCOUNTER — Encounter: Payer: Self-pay | Admitting: Neurology

## 2021-04-19 ENCOUNTER — Ambulatory Visit (INDEPENDENT_AMBULATORY_CARE_PROVIDER_SITE_OTHER): Payer: Medicare Other | Admitting: Neurology

## 2021-04-19 VITALS — Ht 61.0 in | Wt 117.0 lb

## 2021-04-19 DIAGNOSIS — G301 Alzheimer's disease with late onset: Secondary | ICD-10-CM | POA: Diagnosis not present

## 2021-04-19 DIAGNOSIS — F0280 Dementia in other diseases classified elsewhere without behavioral disturbance: Secondary | ICD-10-CM

## 2021-04-19 DIAGNOSIS — R269 Unspecified abnormalities of gait and mobility: Secondary | ICD-10-CM

## 2021-04-19 MED ORDER — MEMANTINE HCL 10 MG PO TABS: 10.0000 mg | ORAL_TABLET | Freq: Two times a day (BID) | ORAL | 11 refills | Status: AC

## 2021-04-19 NOTE — Patient Instructions (Signed)
Start Namenda 10 nightly for one week then increase to 10 mg twice daily  ?Continue current medications  ?Follow up in 1 year  ? ? ? ?There are well-accepted and sensible ways to reduce risk for Alzheimers disease and other degenerative brain disorders . ? ?Exercise Daily Walk A daily 20 minute walk should be part of your routine. Disease related apathy can be a significant roadblock to exercise and the only way to overcome this is to make it a daily routine and perhaps have a reward at the end (something your loved one loves to eat or drink perhaps) or a personal trainer coming to the home can also be very useful. Most importantly, the patient is much more likely to exercise if the caregiver / spouse does it with him/her. In general a structured, repetitive schedule is best. ? ?General Health: Any diseases which effect your body will effect your brain such as a pneumonia, urinary infection, blood clot, heart attack or stroke. Keep contact with your primary care doctor for regular follow ups. ? ?Sleep. A good nights sleep is healthy for the brain. Seven hours is recommended. If you have insomnia or poor sleep habits we can give you some instructions. If you have sleep apnea wear your mask. ? ?Diet: Eating a heart healthy diet is also a good idea; fish and poultry instead of red meat, nuts (mostly non-peanuts), vegetables, fruits, olive oil or canola oil (instead of butter), minimal salt (use other spices to flavor foods), whole grain rice, bread, cereal and pasta and wine in moderation.Research is now showing that the MIND diet, which is a combination of The Mediterranean diet and the DASH diet, is beneficial for cognitive processing and longevity. Information about this diet can be found in The MIND Diet, a book by Doyne Keel, MS, RDN, and online at [employer]book[employer].si ? ?Finances, Power of Producer, television/film/video Directives: You should consider putting legal safeguards in place with regard  to financial and medical decision making. While the spouse always has power of attorney for medical and financial issues in the absence of any form, you should consider what you want in case the spouse / caregiver is no longer around or capable of making decisions.  ? ? ? ?Heart-head connection ? ?New research shows there are things we can do to reduce the risk of mild cognitive impairment and dementia. ? ?Several conditions known to increase the risk of cardiovascular disease -- such as high blood pressure, diabetes and high cholesterol -- also increase the risk of developing Alzheimer's. Some autopsy studies show that as many as 37 percent of individuals with Alzheimer's disease also have cardiovascular disease. ? ?A longstanding question is why some people develop hallmark Alzheimer's plaques and tangles but do not develop the symptoms of Alzheimer's. Vascular disease may help researchers eventually find an answer. Some autopsy studies suggest that plaques and tangles may be present in the brain without causing symptoms of cognitive decline unless the brain also shows evidence of vascular disease. More research is needed to better understand the link between vascular health and Alzheimer's. ? ?Physical exercise and diet ?Regular physical exercise may be a beneficial strategy to lower the risk of Alzheimer's and vascular dementia. Exercise may directly benefit brain cells by increasing blood and oxygen flow in the brain. Because of its known cardiovascular benefits, a medically approved exercise program is a valuable part of any overall wellness plan. ? ?Current evidence suggests that heart-healthy eating may also help protect the brain. Heart-healthy eating  includes limiting the intake of sugar and saturated fats and making sure to eat plenty of fruits, vegetables, and whole grains. No one diet is best. Two diets that have been studied and may be beneficial are the DASH (Dietary Approaches to Stop Hypertension)  diet and the Mediterranean diet. The DASH diet emphasizes vegetables, fruits and fat-free or low-fat dairy products; includes whole grains, fish, poultry, beans, seeds, nuts and vegetable oils; and limits sodium, sweets, sugary beverages and red meats. A Mediterranean diet includes relatively little red meat and emphasizes whole grains, fruits and vegetables, fish and shellfish, and nuts, olive oil and other healthy fats. ? ?Social connections and intellectual activity ?A number of studies indicate that maintaining strong social connections and keeping mentally active as we age might lower the risk of cognitive decline and Alzheimer's. Experts are not certain about the reason for this association. It may be due to direct mechanisms through which social and mental stimulation strengthen connections between nerve cells in the brain. ? ?Head trauma ?There appears to be a strong link between future risk of Alzheimer's and serious head trauma, especially when injury involves loss of consciousness. You can help reduce your risk of Alzheimer's by protecting your head. ? Wear a seat belt ? Use a helmet when participating in sports ? "Fall-proof" your home ?  ?What you can do now ?While research is not yet conclusive, certain lifestyle choices, such as physical activity and diet, may help support brain health and prevent Alzheimer's. Many of these lifestyle changes have been shown to lower the risk of other diseases, like heart disease and diabetes, which have been linked to Alzheimer's. With few drawbacks and plenty of known benefits, healthy lifestyle choices can improve your health and possibly protect your brain. ? ?Learn more about brain health. ?You can help increase our knowledge by considering participation in a clinical study. Our free clinical trial matching services, TrialMatch?, can help you find clinical trials in your area that are seeking volunteers. ? ?  ?

## 2021-04-19 NOTE — Progress Notes (Signed)
[facility]  PATIENT: Amanda Travis DOB: 1928/10/26  REQUESTING CLINICIAN: Samuel Bouche, NP HISTORY FROM: Patient and daughter  REASON FOR VISIT: memory decline    HISTORICAL  CHIEF COMPLAINT:  Chief Complaint  Patient presents with   New Patient (Initial Visit)    Rm 12, with daughter  NP/Internal referral for memory disturbance Reports memory declined around 12/2020, states pt gets very confused  Moca 8/30    HISTORY OF PRESENT ILLNESS:  This is a 88 year old woman with past medical history including coronary artery disease status post pacemaker placement, gait abnormality, who is presenting with memory decline for the past year.  Patient lives with her daughter 49-months of the year and from September to November she goes to [place] to visit her other daughter.  Daughter reports last November when she got back from [place] she was confused, felt like she was still in [place] and was getting ready to go back to [place].  It took her about a few days to realize that she was in [place].  Daughter also reported in January patient was confused about her walker, she was asking for second walker outside when in fact she only had 1.  Daughter also reports that she is forgetful, she repeat herself, saying the same story multiple times, sometimes she will forget what she had for dinner.  She also has word finding difficulty and she does jump from 1 topic to another in conversation.     TBI:   No past history of TBI Stroke:   no past history of stroke Seizures:   no past history of seizures Sleep:   no history of sleep apnea.   Mood:   patient denies anxiety and depression  Functional status: independent in all ADLs  Patient lives with daughter. Cooking: make her own breakfast, she can use the microwaves  Cleaning: No Shopping: NO  Bathing: No help  Toileting: No  Driving: No  Bills: Daughter is helping with the bills  Ever left the stove on  by accident?: She left the stove on once Forget how to use items around the house?: Confusion about the microwave  Getting lost going to familiar places?: No  Forgetting loved ones names?: Denies  Word finding difficulty? Yes  Sleep: Reports it is good    OTHER MEDICAL CONDITIONS: History of Bell's palsy, coronary artery disease status post pacemaker placement,   REVIEW OF SYSTEMS: Full 14 system review of systems performed and negative with exception of: as noted in the HPI   ALLERGIES: Allergies  Allergen Reactions   Cephalexin Diarrhea and Other (See Comments)   Valacyclovir Hcl Other (See Comments)   Atorvastatin Diarrhea   Tetracyclines & Related Diarrhea   Valacyclovir Diarrhea    HOME MEDICATIONS: Outpatient Medications Prior to Visit  Medication Sig Dispense Refill   AMBULATORY NON FORMULARY MEDICATION Multi-wheeled walker with seat. Dx: ambulatory dysfunction, fall risk, osteoarthritis. Fax Rx to [phone] 1 Units prn   apixaban (ELIQUIS) 2.5 MG TABS tablet Take 2.5 mg by mouth 2 (two) times daily.     ARTIFICIAL TEARS 0.1-0.3 % SOLN Apply to eye as needed.      Calcium Carbonate-Vitamin D 600-400 MG-UNIT chew tablet Chew 2 tablets by mouth daily. 60 tablet 12   Echinacea 125 MG CAPS Take by mouth. Take 200 mg by mouth daily     Flaxseed, Linseed, (FLAXSEED OIL PO) Take 1,000 mg by mouth. 2 tablets daily     furosemide (LASIX) 40 MG tablet  Take 40 mg by mouth daily.     Garlic 4010 MG TABS Take by mouth. Take 1000 mg by mouth daily     Glucosamine Sulfate 500 MG TABS Take by mouth. Take 200 mg by mouth daily     ibandronate (BONIVA) 150 MG tablet Take 1 tablet (150 mg total) by mouth every 30 (thirty) days. Take in the morning with a full glass of water, on an empty stomach, and do not take anything else by mouth or lie down for the next 30 min. 3 tablet 3   latanoprost (XALATAN) 0.005 % ophthalmic solution Place 1 drop into both eyes at bedtime.      loratadine  (CLARITIN) 10 MG tablet Take 10 mg by mouth daily.     Lysine 500 MG CAPS Take by mouth. Take 1000 mg by mouth daily     Magnesium Chloride 64 MG TBEC Take 2 tablets (128 mg total) by mouth daily. 180 tablet 0   Melatonin 5 MG TABS Take by mouth. Take 5 mg by mouth daily     Multiple Vitamin (MULTIVITAMIN) tablet Take 1 tablet by mouth daily.     oxybutynin (DITROPAN) 5 MG tablet TAKE 1 TABLET(5 MG) BY MOUTH TWICE DAILY 180 tablet 3   pantoprazole (PROTONIX) 40 MG tablet Take 1 tablet (40 mg total) by mouth daily. 90 tablet 3   Policosanol 10 MG CAPS Take by mouth. Take by mouth 2 times daily     Potassium 99 MG TABS Take 1 tablet by mouth daily.      pravastatin (PRAVACHOL) 10 MG tablet TAKE 1 TABLET(10 MG) BY MOUTH DAILY 90 tablet 3   Probiotic Product (PROBIOTIC MULTI-ENZYME PO) Take 80 mg by mouth. 2 tablets daily     pyridOXINE (VITAMIN B-6) 100 MG tablet Take 100 mg by mouth daily.     vitamin B-12 (CYANOCOBALAMIN) 100 MCG tablet Take 100 mcg by mouth daily.     vitamin C (ASCORBIC ACID) 500 MG tablet Take 500 mg by mouth daily.     No facility-administered medications prior to visit.    PAST MEDICAL HISTORY: Past Medical History:  Diagnosis Date   Cardiac pacemaker in situ 03/08/2015   Placed for Tachy/Brady syndrome    Cardiac risk counseling 03/08/2015   Await lipids ot calculate ASCVD risk score. No hx known MI/CVA    Cough 03/08/2015   Disorder of bone and cartilage 03/08/2015   Gait instability 04/19/2015   History of glaucoma 03/08/2015   History of skin cancer 03/08/2015   History of tachycardia-bradycardia syndrome 03/08/2015   Hyperlipidemia 03/08/2015   Osteoporosis 03/08/2015   previously on Fosamax, pt told to stop this due to dental procedure. Will repeat DEXA.     Palpitations 03/08/2015   Pt concerned for Afib.CHA2DDS2-VASc Score 3.2%. Pt reports pacemaker check revealed AFib but I don't see Afib on PM reports, EKG sinus    Postmenopausal 03/08/2015    PAST SURGICAL  HISTORY: Past Surgical History:  Procedure Laterality Date   ABDOMINAL HYSTERECTOMY  1979   APPENDECTOMY     BLOOD CLOT  2006   CHOLECYSTECTOMY     COLON SURGERY  1998   EYE SURGERY     CATARACT   JOINT REPLACEMENT Bilateral    KNEE   MOHS SURGERY  2015   OVARIAN CANCER  1997   TOOTH EXTRACTION      FAMILY HISTORY: Family History  Problem Relation Age of Onset   Stroke Mother    Heart disease  Sister    Dementia Brother    Heart disease Brother     SOCIAL HISTORY: Social History   Socioeconomic History   Marital status: Widowed    Spouse name: Not on file   Number of children: Not on file   Years of education: Not on file   Highest education level: Not on file  Occupational History   Not on file  Tobacco Use   Smoking status: Never   Smokeless tobacco: Never  Substance and Sexual Activity   Alcohol use: No   Drug use: No   Sexual activity: Never  Other Topics Concern   Not on file  Social History Narrative   Not on file   Social Determinants of Health   Financial Resource Strain: Not on file  Food Insecurity: Not on file  Transportation Needs: Not on file  Physical Activity: Not on file  Stress: Not on file  Social Connections: Not on file  Intimate Partner Violence: Not on file    PHYSICAL EXAM  GENERAL EXAM/CONSTITUTIONAL: Vitals:  Vitals:   04/19/21 0840  Weight: 117 lb (53.1 kg)  Height: 5\' 1"  (1.549 m)   Body mass index is 22.11 kg/m. Wt Readings from Last 3 Encounters:  04/19/21 117 lb (53.1 kg)  03/26/21 116 lb (52.6 kg)  08/11/20 121 lb (54.9 kg)   Patient is in no distress; well developed, nourished and groomed; neck is supple  EYES: Pupils round and reactive to light, Visual fields full to confrontation, Extraocular movements intacts,   MUSCULOSKELETAL: Gait, strength, tone, movements noted in Neurologic exam below  NEUROLOGIC: MENTAL STATUS:  No flowsheet data found. awake, alert language fluent, comprehension intact,  naming intact fund of knowledge appropriate  Montreal Cognitive Assessment  04/19/2021  Visuospatial/ Executive (0/5) 2  Naming (0/3) 2  Attention: Read list of digits (0/2) 0  Attention: Read list of letters (0/1) 0  Attention: Serial 7 subtraction starting at 100 (0/3) 0  Language: Repeat phrase (0/2) 0  Language : Fluency (0/1) 0  Abstraction (0/2) 0  Delayed Recall (0/5) 0  Orientation (0/6) 4  Total 8     CRANIAL NERVE:  2nd, 3rd, 4th, 6th - pupils equal and reactive to light, visual fields full to confrontation, extraocular muscles intact, no nystagmus 5th - facial sensation symmetric 7th - facial strength symmetric 8th - hearing intact 9th - palate elevates symmetrically, uvula midline 11th - shoulder shrug symmetric 12th - tongue protrusion midline  MOTOR:  normal bulk and tone, full strength in the BUE, BLE  SENSORY:  normal and symmetric to light touch  COORDINATION:  finger-nose-finger, fine finger movements normal   GAIT/STATION:  Ambulates with a walker     DIAGNOSTIC DATA (LABS, IMAGING, TESTING) - I reviewed patient records, labs, notes, testing and imaging myself where available.  Lab Results  Component Value Date   WBC 6.6 08/11/2020   HGB 13.9 08/11/2020   HCT 42.5 08/11/2020   MCV 92.8 08/11/2020   PLT 206 08/11/2020      Component Value Date/Time   NA 143 08/11/2020 0000   K 4.1 08/11/2020 0000   CL 102 08/11/2020 0000   CO2 32 08/11/2020 0000   GLUCOSE 116 (H) 08/11/2020 0000   BUN 25 08/11/2020 0000   CREATININE 1.01 (H) 08/11/2020 0000   CALCIUM 9.4 08/11/2020 0000   PROT 6.7 08/11/2020 0000   ALBUMIN 3.9 05/07/2016 1449   AST 17 08/11/2020 0000   ALT 10 08/11/2020 0000   ALKPHOS 57  05/07/2016 1449   BILITOT 0.7 08/11/2020 0000   GFRNONAA 49 (L) 08/11/2020 0000   GFRAA 57 (L) 08/11/2020 0000   Lab Results  Component Value Date   CHOL 182 08/11/2020   HDL 69 08/11/2020   LDLCALC 91 08/11/2020   TRIG 119 08/11/2020    CHOLHDL 2.6 08/11/2020   Lab Results  Component Value Date   HGBA1C 4.6 12/07/2015   Lab Results  Component Value Date   VITAMINB12 1,564 (H) 08/11/2020   Lab Results  Component Value Date   TSH 2.69 08/11/2020      ASSESSMENT AND PLAN  88 y.o. year old female with past medical history including history of Bell's palsy, coronary artery disease status post pacemaker placement, gait abnormality who is presenting for memory decline for the past year associated with period of confusion.  There is also report of patient being forgetful, repeating herself, asking the same questions multiple times, word finding difficulty and jumping from one topic to the next. On exam today, she scored 8 out of 30 on a Moca consistent with moderate impairment.  I explained to the patient that she likely has mild to moderate Alzheimer disease, and that I would like to start her on Namenda.  She is comfortable with plan. We will start 10 mg nightly for 2 weeks then increase to 10 mg twice daily. I also gave her additional information regarding ways to reduce the progression of the disease including exercising, maintaining good health, good diet and good sleep.  All questions answered.  I will see her in 1 year for follow-up.   1. Late onset Alzheimer's dementia without behavioral disturbance, psychotic disturbance, mood disturbance, or anxiety, unspecified dementia severity (HCC)   2. Gait abnormality      Patient Instructions  Start Namenda 10 nightly for one week then increase to 10 mg twice daily  Continue current medications  Follow up in 1 year     There are well-accepted and sensible ways to reduce risk for Alzheimers disease and other degenerative brain disorders .  Exercise Daily Walk A daily 20 minute walk should be part of your routine. Disease related apathy can be a significant roadblock to exercise and the only way to overcome this is to make it a daily routine and perhaps have a reward at  the end (something your loved one loves to eat or drink perhaps) or a personal trainer coming to the home can also be very useful. Most importantly, the patient is much more likely to exercise if the caregiver / spouse does it with him/her. In general a structured, repetitive schedule is best.  General Health: Any diseases which effect your body will effect your brain such as a pneumonia, urinary infection, blood clot, heart attack or stroke. Keep contact with your primary care doctor for regular follow ups.  Sleep. A good nights sleep is healthy for the brain. Seven hours is recommended. If you have insomnia or poor sleep habits we can give you some instructions. If you have sleep apnea wear your mask.  Diet: Eating a heart healthy diet is also a good idea; fish and poultry instead of red meat, nuts (mostly non-peanuts), vegetables, fruits, olive oil or canola oil (instead of butter), minimal salt (use other spices to flavor foods), whole grain rice, bread, cereal and pasta and wine in moderation.Research is now showing that the MIND diet, which is a combination of The Mediterranean diet and the DASH diet, is beneficial for cognitive processing and longevity.  Information about this diet can be found in The MIND Diet, a book by Doyne Keel, MS, RDN, and online at [employer]bookDist[employer].si  Finances, Power of Attorney and Advance Directives: You should consider putting legal safeguards in place with regard to financial and medical decision making. While the spouse always has power of attorney for medical and financial issues in the absence of any form, you should consider what you want in case the spouse / caregiver is no longer around or capable of making decisions.     Heart-head connection  New research shows there are things we can do to reduce the risk of mild cognitive impairment and dementia.  Several conditions known to increase the risk of cardiovascular disease -- such  as high blood pressure, diabetes and high cholesterol -- also increase the risk of developing Alzheimer's. Some autopsy studies show that as many as 27 percent of individuals with Alzheimer's disease also have cardiovascular disease.  A longstanding question is why some people develop hallmark Alzheimer's plaques and tangles but do not develop the symptoms of Alzheimer's. Vascular disease may help researchers eventually find an answer. Some autopsy studies suggest that plaques and tangles may be present in the brain without causing symptoms of cognitive decline unless the brain also shows evidence of vascular disease. More research is needed to better understand the link between vascular health and Alzheimer's.  Physical exercise and diet Regular physical exercise may be a beneficial strategy to lower the risk of Alzheimer's and vascular dementia. Exercise may directly benefit brain cells by increasing blood and oxygen flow in the brain. Because of its known cardiovascular benefits, a medically approved exercise program is a valuable part of any overall wellness plan.  Current evidence suggests that heart-healthy eating may also help protect the brain. Heart-healthy eating includes limiting the intake of sugar and saturated fats and making sure to eat plenty of fruits, vegetables, and whole grains. No one diet is best. Two diets that have been studied and may be beneficial are the DASH (Dietary Approaches to Stop Hypertension) diet and the Mediterranean diet. The DASH diet emphasizes vegetables, fruits and fat-free or low-fat dairy products; includes whole grains, fish, poultry, beans, seeds, nuts and vegetable oils; and limits sodium, sweets, sugary beverages and red meats. A Mediterranean diet includes relatively little red meat and emphasizes whole grains, fruits and vegetables, fish and shellfish, and nuts, olive oil and other healthy fats.  Social connections and intellectual activity A number of  studies indicate that maintaining strong social connections and keeping mentally active as we age might lower the risk of cognitive decline and Alzheimer's. Experts are not certain about the reason for this association. It may be due to direct mechanisms through which social and mental stimulation strengthen connections between nerve cells in the brain.  Head trauma There appears to be a strong link between future risk of Alzheimer's and serious head trauma, especially when injury involves loss of consciousness. You can help reduce your risk of Alzheimer's by protecting your head.  Wear a seat belt  Use a helmet when participating in sports  "Fall-proof" your home   What you can do now While research is not yet conclusive, certain lifestyle choices, such as physical activity and diet, may help support brain health and prevent Alzheimer's. Many of these lifestyle changes have been shown to lower the risk of other diseases, like heart disease and diabetes, which have been linked to Alzheimer's. With few drawbacks and plenty of known benefits, healthy lifestyle choices  can improve your health and possibly protect your brain.  Learn more about brain health. You can help increase our knowledge by considering participation in a clinical study. Our free clinical trial matching services, TrialMatch, can help you find clinical trials in your area that are seeking volunteers.     No orders of the defined types were placed in this encounter.   Meds ordered this encounter  Medications   memantine (NAMENDA) 10 MG tablet    Sig: Take 1 tablet (10 mg total) by mouth 2 (two) times daily.    Dispense:  60 tablet    Refill:  11    Return in about 1 year (around 04/20/2022).  I have spent a total of 50 minutes dedicated to this patient today, preparing to see patient, performing a medically appropriate examination and evaluation, ordering tests and/or medications and procedures, and counseling and educating  the patient/family/caregiver; independently interpreting result and communicating results to the family/patient/caregiver; and documenting clinical information in the electronic medical record.   Alric Ran, MD 04/19/2021, 9:28 AM  [facility] [address], [place], [place] [address] [phone]

## 2021-05-13 NOTE — Progress Notes (Signed)
?  HPI with pertinent ROS:  ? ?CC: chronic disease follow up ? ?HPI: ?Pleasant 88 year old female accompanied by her daughter presenting today for a 3 month follow up. She has been doing well overall since our last appointment. Compliant with all of her medications and has no current complaints or concerning symptoms. Her daughter notes that there was an episode this morning where Amanda Travis was in the bathroom. Her daughter knocked on the door but got no response. She knocked again a few minutes later and asked if Amanda Travis was okay. Amanda Travis answered no she wasn't but would not clarify further. Her daughter knocked a third time and this time Amanda Travis responded by saying she was trying to have a BM. This was outside of her normal interactions and it was a bit worrisome. Of note, Amanda Travis did make it to her neurology appointment and has been diagnosed with early dementia. She was started on Namenda which she has been taking for a few weeks. She is doing ok on the medication so far but reports it is causing constipation. Does not eat fruits and veggies and prefers meat/starches. Is not taking a fiber supplement.  ? ?I reviewed the past medical history, family history, social history, surgical history, and allergies today and no changes were needed.  Please see the problem list section below in epic for further details. ? ? ?Physical exam:  ? ?General: Well Developed, well nourished, and in no acute distress.  ?Neuro: Alert and oriented x3.  ?HEENT: Normocephalic, atraumatic.  ?Skin: Warm and dry. ?Cardiac: Regular rate and rhythm, no murmurs rubs or gallops, no lower extremity edema.  ?Respiratory: Clear to auscultation bilaterally. Not using accessory muscles, speaking in full sentences. ? ?Impression and Recommendations:   ? ?1. Drug-induced constipation ?Recommend adding in a daily fiber supplement. Increase dietary fiber intake. Can consider using Miralax prn if needed.  ? ?2. Gait instability ?Has a Rollator walker,  elevated toilet seat, and arms in place to help with rising from the toilet. Denies the need for further equipment to manage instability.  ? ?3. Other osteoporosis, unspecified pathological fracture presence ?Continue Boniva as prescribed.  ? ?4. Permanent atrial fibrillation ([place]) ?Continue Eliquis. Managed by cardiology.  ? ?5. Gastroesophageal reflux disease with esophagitis without hemorrhage ?Continue Protonix as prescribed.  ? ?6. Pure hypercholesterolemia ?Continue pravastatin '10mg'$  daily as prescribed.  ? ?Return in about 6 months (around 11/14/2021) for chronic disease follow up. ?___________________________________________ ?Amanda Sorrel, DNP, APRN, FNP-BC ?Primary Care and Sports Medicine ?[facility] ?

## 2021-05-14 ENCOUNTER — Encounter: Payer: Self-pay | Admitting: Medical-Surgical

## 2021-05-14 ENCOUNTER — Other Ambulatory Visit: Payer: Self-pay

## 2021-05-14 ENCOUNTER — Ambulatory Visit (INDEPENDENT_AMBULATORY_CARE_PROVIDER_SITE_OTHER): Payer: Medicare Other | Admitting: Medical-Surgical

## 2021-05-14 VITALS — BP 132/79 | HR 96 | Resp 20 | Ht 61.0 in | Wt 110.0 lb

## 2021-05-14 DIAGNOSIS — E7800 Pure hypercholesterolemia, unspecified: Secondary | ICD-10-CM | POA: Diagnosis not present

## 2021-05-14 DIAGNOSIS — I4821 Permanent atrial fibrillation: Secondary | ICD-10-CM | POA: Diagnosis not present

## 2021-05-14 DIAGNOSIS — M818 Other osteoporosis without current pathological fracture: Secondary | ICD-10-CM | POA: Diagnosis not present

## 2021-05-14 DIAGNOSIS — K2100 Gastro-esophageal reflux disease with esophagitis, without bleeding: Secondary | ICD-10-CM | POA: Diagnosis not present

## 2021-05-14 DIAGNOSIS — R2681 Unsteadiness on feet: Secondary | ICD-10-CM

## 2021-05-14 DIAGNOSIS — K5903 Drug induced constipation: Secondary | ICD-10-CM

## 2021-05-29 ENCOUNTER — Telehealth: Payer: Self-pay | Admitting: Neurology

## 2021-05-29 ENCOUNTER — Other Ambulatory Visit: Payer: Self-pay | Admitting: Neurology

## 2021-05-29 DIAGNOSIS — Z889 Allergy status to unspecified drugs, medicaments and biological substances status: Secondary | ICD-10-CM | POA: Diagnosis not present

## 2021-05-29 DIAGNOSIS — R21 Rash and other nonspecific skin eruption: Secondary | ICD-10-CM | POA: Diagnosis not present

## 2021-05-29 MED ORDER — DONEPEZIL HCL 5 MG PO TABS: 5.0000 mg | ORAL_TABLET | Freq: Every day | ORAL | 11 refills | Status: AC

## 2021-05-29 NOTE — Telephone Encounter (Signed)
Pt daughter (on [place]) states pt had reaction starting Monday 4/10 on leg from hip to knee and on lower back. Went to urgent care prescribed topical steroid oral pill for reaction. (does not have names of medications yet). Believe reaction to be from memantine (NAMENDA) 10 MG tablet.  Would like a call back about next steps with medication. ?

## 2021-05-29 NOTE — Telephone Encounter (Signed)
We can start wit Aricept 5 mg nightly. I will write a new Rx.

## 2021-05-29 NOTE — Telephone Encounter (Signed)
Spoke to her dgt Marlowe Kays Tolppi). Reports the patient has been struggling with constipation since starting memantine about six weeks ago. They have been managing it. More recently, she developed a rash on her lower back, waist, hips, upper part of lower extremities. No changes in laundry detergents, soaps, lotions. No new clothes or sheets. Memantine is the only thing new. She was seen in urgent care who felt the rash was related to the new medication. Prescribed steroids. Memantine discontinued. Patient's daughter would like to know if her mother should try an alternate treatment.  ?

## 2021-05-29 NOTE — Telephone Encounter (Signed)
I spoke to the patient's daughter. She is agreeable to try the donepezil. She plans to allow her mother to finish the steroids and the rash to clear up before starting a new medication. She will call us back with any further concerns.  ?

## 2021-06-04 ENCOUNTER — Telehealth: Payer: Self-pay | Admitting: Neurology

## 2021-06-04 NOTE — Telephone Encounter (Signed)
Left message for a return call

## 2021-06-04 NOTE — Telephone Encounter (Signed)
Thank you Kristen, no additional recommendation, but they should follow up with PCP to rule out infection causing acute confusion.  ? ?Thank you  ?Dr. April Manson

## 2021-06-04 NOTE — Telephone Encounter (Signed)
I called patient's daughter, Marlowe Kays, per [facility] ? ?Patient's daughter has noticed a worsening in patient's memory. They have noticed that the stove has been left on, etc. Patient's daughter works from home but is unable to supervise patient all day. They are interested in hiring a in-home aide for patient for safety. They are also interested in admitting patient to an ALF. ? ?This morning, patient seemed to be acutely confused about how to eat breakfast which is a deviation from her baseline. Otherwise, patient is stable. I encouraged patient's daughter to discuss a possible UA to rule out UTI causing acute confusion with PCP. ? ?I advised patient's daughter that an in-home aide is not likely to be covered by insurance.  I recommended that she contact patient's insurance to see if that will be covered.  It is also worth researching local agencies that offer this care.  I advised her that they will likely have to pay out-of-pocket for this. ? ?I also advised patient's daughter that they should research assisted living facilities locally.  She will also need to check with patient's insurance to see how much will be covered. I recommended they also contact their primary care office for further recommendations on ALFs.  ? ?Patient's daughter verbalized understanding and appreciation for my recommendations. I advised her that if Dr. April Manson has any further recommendations that we will call her back. ?

## 2021-06-04 NOTE — Telephone Encounter (Signed)
Pt's memory and confusion is worsening, daughter is asking for assistance with more in home care or assistance in getting pt into in assisted living facility, please call. ?

## 2021-06-05 ENCOUNTER — Telehealth: Payer: Self-pay

## 2021-06-05 DIAGNOSIS — R413 Other amnesia: Secondary | ICD-10-CM

## 2021-06-05 DIAGNOSIS — I495 Sick sinus syndrome: Secondary | ICD-10-CM | POA: Diagnosis not present

## 2021-06-05 NOTE — Telephone Encounter (Signed)
The daughter called concerned about memory issues for the patient. She is wanting to know is she can get help with long term care or assisted living. ? ? ?

## 2021-06-05 NOTE — Telephone Encounter (Signed)
Unfortunately, I do not have a lot of resources when it comes to long-term care or assisted living.  What I do have the availability to do is get him in touch with a social worker that may be able to direct them to available resources.  I have put that referral in so they should keep an eye out for a call from them. ? ?Thanks, ?Luma Clopper ?

## 2021-06-08 NOTE — Telephone Encounter (Signed)
Connie advised. 

## 2021-06-11 DIAGNOSIS — Z85828 Personal history of other malignant neoplasm of skin: Secondary | ICD-10-CM | POA: Diagnosis not present

## 2021-06-11 DIAGNOSIS — L821 Other seborrheic keratosis: Secondary | ICD-10-CM | POA: Diagnosis not present

## 2021-06-11 DIAGNOSIS — L2089 Other atopic dermatitis: Secondary | ICD-10-CM | POA: Diagnosis not present

## 2021-06-11 DIAGNOSIS — L570 Actinic keratosis: Secondary | ICD-10-CM | POA: Diagnosis not present

## 2021-06-11 DIAGNOSIS — L820 Inflamed seborrheic keratosis: Secondary | ICD-10-CM | POA: Diagnosis not present

## 2021-06-13 ENCOUNTER — Telehealth: Payer: Self-pay | Admitting: Medical-Surgical

## 2021-06-13 NOTE — Telephone Encounter (Signed)
Family member to patient dropped off forms to be filled out. They are in regards to an Adult daycare program ?

## 2021-06-13 NOTE — Telephone Encounter (Signed)
Contacted Marlowe Kays and made her aware that there is a $29 form fee that needs to be paid before we can fax the forms. ? ? ? ?

## 2021-06-18 DIAGNOSIS — I959 Hypotension, unspecified: Secondary | ICD-10-CM | POA: Diagnosis not present

## 2021-06-18 DIAGNOSIS — Z7901 Long term (current) use of anticoagulants: Secondary | ICD-10-CM | POA: Diagnosis not present

## 2021-06-18 DIAGNOSIS — Z792 Long term (current) use of antibiotics: Secondary | ICD-10-CM | POA: Diagnosis not present

## 2021-06-18 DIAGNOSIS — M1990 Unspecified osteoarthritis, unspecified site: Secondary | ICD-10-CM | POA: Diagnosis not present

## 2021-06-18 DIAGNOSIS — E785 Hyperlipidemia, unspecified: Secondary | ICD-10-CM | POA: Diagnosis not present

## 2021-06-18 DIAGNOSIS — J449 Chronic obstructive pulmonary disease, unspecified: Secondary | ICD-10-CM | POA: Diagnosis not present

## 2021-06-18 DIAGNOSIS — L89150 Pressure ulcer of sacral region, unstageable: Secondary | ICD-10-CM | POA: Diagnosis not present

## 2021-06-18 DIAGNOSIS — H409 Unspecified glaucoma: Secondary | ICD-10-CM | POA: Diagnosis not present

## 2021-06-18 DIAGNOSIS — Z66 Do not resuscitate: Secondary | ICD-10-CM | POA: Diagnosis not present

## 2021-06-18 DIAGNOSIS — M6281 Muscle weakness (generalized): Secondary | ICD-10-CM | POA: Diagnosis not present

## 2021-06-18 DIAGNOSIS — M25519 Pain in unspecified shoulder: Secondary | ICD-10-CM | POA: Diagnosis not present

## 2021-06-18 DIAGNOSIS — S6992XA Unspecified injury of left wrist, hand and finger(s), initial encounter: Secondary | ICD-10-CM | POA: Diagnosis not present

## 2021-06-18 DIAGNOSIS — Z8543 Personal history of malignant neoplasm of ovary: Secondary | ICD-10-CM | POA: Diagnosis not present

## 2021-06-18 DIAGNOSIS — I480 Paroxysmal atrial fibrillation: Secondary | ICD-10-CM | POA: Diagnosis not present

## 2021-06-18 DIAGNOSIS — Z23 Encounter for immunization: Secondary | ICD-10-CM | POA: Diagnosis not present

## 2021-06-18 DIAGNOSIS — Z8719 Personal history of other diseases of the digestive system: Secondary | ICD-10-CM | POA: Diagnosis not present

## 2021-06-18 DIAGNOSIS — M810 Age-related osteoporosis without current pathological fracture: Secondary | ICD-10-CM | POA: Diagnosis not present

## 2021-06-18 DIAGNOSIS — Z9049 Acquired absence of other specified parts of digestive tract: Secondary | ICD-10-CM | POA: Diagnosis not present

## 2021-06-18 DIAGNOSIS — S42411A Displaced simple supracondylar fracture without intercondylar fracture of right humerus, initial encounter for closed fracture: Secondary | ICD-10-CM | POA: Diagnosis not present

## 2021-06-18 DIAGNOSIS — K219 Gastro-esophageal reflux disease without esophagitis: Secondary | ICD-10-CM | POA: Diagnosis not present

## 2021-06-18 DIAGNOSIS — S42412A Displaced simple supracondylar fracture without intercondylar fracture of left humerus, initial encounter for closed fracture: Secondary | ICD-10-CM | POA: Diagnosis not present

## 2021-06-18 DIAGNOSIS — Z79899 Other long term (current) drug therapy: Secondary | ICD-10-CM | POA: Diagnosis not present

## 2021-06-18 DIAGNOSIS — W19XXXA Unspecified fall, initial encounter: Secondary | ICD-10-CM | POA: Diagnosis not present

## 2021-06-18 DIAGNOSIS — Z888 Allergy status to other drugs, medicaments and biological substances status: Secondary | ICD-10-CM | POA: Diagnosis not present

## 2021-06-18 DIAGNOSIS — N3281 Overactive bladder: Secondary | ICD-10-CM | POA: Diagnosis not present

## 2021-06-18 DIAGNOSIS — R531 Weakness: Secondary | ICD-10-CM | POA: Diagnosis not present

## 2021-06-18 DIAGNOSIS — R8281 Pyuria: Secondary | ICD-10-CM | POA: Diagnosis not present

## 2021-06-18 DIAGNOSIS — N390 Urinary tract infection, site not specified: Secondary | ICD-10-CM | POA: Diagnosis not present

## 2021-06-18 DIAGNOSIS — G510 Bell's palsy: Secondary | ICD-10-CM | POA: Diagnosis not present

## 2021-06-18 DIAGNOSIS — M859 Disorder of bone density and structure, unspecified: Secondary | ICD-10-CM | POA: Diagnosis not present

## 2021-06-18 DIAGNOSIS — I4891 Unspecified atrial fibrillation: Secondary | ICD-10-CM | POA: Diagnosis not present

## 2021-06-18 DIAGNOSIS — S42402D Unspecified fracture of lower end of left humerus, subsequent encounter for fracture with routine healing: Secondary | ICD-10-CM | POA: Diagnosis not present

## 2021-06-18 DIAGNOSIS — I495 Sick sinus syndrome: Secondary | ICD-10-CM | POA: Diagnosis not present

## 2021-06-18 DIAGNOSIS — R58 Hemorrhage, not elsewhere classified: Secondary | ICD-10-CM | POA: Diagnosis not present

## 2021-06-18 DIAGNOSIS — Z01818 Encounter for other preprocedural examination: Secondary | ICD-10-CM | POA: Diagnosis not present

## 2021-06-18 DIAGNOSIS — Z950 Presence of cardiac pacemaker: Secondary | ICD-10-CM | POA: Diagnosis not present

## 2021-06-18 DIAGNOSIS — Z043 Encounter for examination and observation following other accident: Secondary | ICD-10-CM | POA: Diagnosis not present

## 2021-06-18 DIAGNOSIS — Z743 Need for continuous supervision: Secondary | ICD-10-CM | POA: Diagnosis not present

## 2021-06-18 DIAGNOSIS — S42412D Displaced simple supracondylar fracture without intercondylar fracture of left humerus, subsequent encounter for fracture with routine healing: Secondary | ICD-10-CM | POA: Diagnosis not present

## 2021-06-18 DIAGNOSIS — S59902A Unspecified injury of left elbow, initial encounter: Secondary | ICD-10-CM | POA: Diagnosis not present

## 2021-06-18 DIAGNOSIS — M8000XA Age-related osteoporosis with current pathological fracture, unspecified site, initial encounter for fracture: Secondary | ICD-10-CM | POA: Diagnosis not present

## 2021-06-18 DIAGNOSIS — Z96653 Presence of artificial knee joint, bilateral: Secondary | ICD-10-CM | POA: Diagnosis not present

## 2021-06-18 DIAGNOSIS — H9190 Unspecified hearing loss, unspecified ear: Secondary | ICD-10-CM | POA: Diagnosis not present

## 2021-06-18 DIAGNOSIS — Z20822 Contact with and (suspected) exposure to covid-19: Secondary | ICD-10-CM | POA: Diagnosis not present

## 2021-06-22 DIAGNOSIS — M8000XA Age-related osteoporosis with current pathological fracture, unspecified site, initial encounter for fracture: Secondary | ICD-10-CM | POA: Diagnosis not present

## 2021-06-22 DIAGNOSIS — Z8719 Personal history of other diseases of the digestive system: Secondary | ICD-10-CM | POA: Diagnosis not present

## 2021-06-22 DIAGNOSIS — K140 Glossitis: Secondary | ICD-10-CM | POA: Diagnosis not present

## 2021-06-22 DIAGNOSIS — Z23 Encounter for immunization: Secondary | ICD-10-CM | POA: Diagnosis not present

## 2021-06-22 DIAGNOSIS — J449 Chronic obstructive pulmonary disease, unspecified: Secondary | ICD-10-CM | POA: Diagnosis not present

## 2021-06-22 DIAGNOSIS — F0390 Unspecified dementia without behavioral disturbance: Secondary | ICD-10-CM | POA: Diagnosis not present

## 2021-06-22 DIAGNOSIS — F03918 Unspecified dementia, unspecified severity, with other behavioral disturbance: Secondary | ICD-10-CM | POA: Diagnosis not present

## 2021-06-22 DIAGNOSIS — Z950 Presence of cardiac pacemaker: Secondary | ICD-10-CM | POA: Diagnosis not present

## 2021-06-22 DIAGNOSIS — H6012 Cellulitis of left external ear: Secondary | ICD-10-CM | POA: Diagnosis not present

## 2021-06-22 DIAGNOSIS — H409 Unspecified glaucoma: Secondary | ICD-10-CM | POA: Diagnosis not present

## 2021-06-22 DIAGNOSIS — R2232 Localized swelling, mass and lump, left upper limb: Secondary | ICD-10-CM | POA: Diagnosis not present

## 2021-06-22 DIAGNOSIS — Z7901 Long term (current) use of anticoagulants: Secondary | ICD-10-CM | POA: Diagnosis not present

## 2021-06-22 DIAGNOSIS — R8281 Pyuria: Secondary | ICD-10-CM | POA: Diagnosis not present

## 2021-06-22 DIAGNOSIS — I10 Essential (primary) hypertension: Secondary | ICD-10-CM | POA: Diagnosis not present

## 2021-06-22 DIAGNOSIS — W19XXXA Unspecified fall, initial encounter: Secondary | ICD-10-CM | POA: Diagnosis not present

## 2021-06-22 DIAGNOSIS — L539 Erythematous condition, unspecified: Secondary | ICD-10-CM | POA: Diagnosis not present

## 2021-06-22 DIAGNOSIS — M859 Disorder of bone density and structure, unspecified: Secondary | ICD-10-CM | POA: Diagnosis not present

## 2021-06-22 DIAGNOSIS — Z743 Need for continuous supervision: Secondary | ICD-10-CM | POA: Diagnosis not present

## 2021-06-22 DIAGNOSIS — K219 Gastro-esophageal reflux disease without esophagitis: Secondary | ICD-10-CM | POA: Diagnosis not present

## 2021-06-22 DIAGNOSIS — R8290 Unspecified abnormal findings in urine: Secondary | ICD-10-CM | POA: Diagnosis not present

## 2021-06-22 DIAGNOSIS — S42302A Unspecified fracture of shaft of humerus, left arm, initial encounter for closed fracture: Secondary | ICD-10-CM | POA: Diagnosis not present

## 2021-06-22 DIAGNOSIS — J439 Emphysema, unspecified: Secondary | ICD-10-CM | POA: Diagnosis not present

## 2021-06-22 DIAGNOSIS — N390 Urinary tract infection, site not specified: Secondary | ICD-10-CM | POA: Diagnosis not present

## 2021-06-22 DIAGNOSIS — Z95818 Presence of other cardiac implants and grafts: Secondary | ICD-10-CM | POA: Diagnosis not present

## 2021-06-22 DIAGNOSIS — I517 Cardiomegaly: Secondary | ICD-10-CM | POA: Diagnosis not present

## 2021-06-22 DIAGNOSIS — K649 Unspecified hemorrhoids: Secondary | ICD-10-CM | POA: Diagnosis not present

## 2021-06-22 DIAGNOSIS — R0602 Shortness of breath: Secondary | ICD-10-CM | POA: Diagnosis not present

## 2021-06-22 DIAGNOSIS — I495 Sick sinus syndrome: Secondary | ICD-10-CM | POA: Diagnosis not present

## 2021-06-22 DIAGNOSIS — M810 Age-related osteoporosis without current pathological fracture: Secondary | ICD-10-CM | POA: Diagnosis not present

## 2021-06-22 DIAGNOSIS — R5381 Other malaise: Secondary | ICD-10-CM | POA: Diagnosis not present

## 2021-06-22 DIAGNOSIS — R7989 Other specified abnormal findings of blood chemistry: Secondary | ICD-10-CM | POA: Diagnosis not present

## 2021-06-22 DIAGNOSIS — M1990 Unspecified osteoarthritis, unspecified site: Secondary | ICD-10-CM | POA: Diagnosis not present

## 2021-06-22 DIAGNOSIS — B370 Candidal stomatitis: Secondary | ICD-10-CM | POA: Diagnosis not present

## 2021-06-22 DIAGNOSIS — I480 Paroxysmal atrial fibrillation: Secondary | ICD-10-CM | POA: Diagnosis not present

## 2021-06-22 DIAGNOSIS — M6281 Muscle weakness (generalized): Secondary | ICD-10-CM | POA: Diagnosis not present

## 2021-06-22 DIAGNOSIS — E785 Hyperlipidemia, unspecified: Secondary | ICD-10-CM | POA: Diagnosis not present

## 2021-06-22 DIAGNOSIS — J9601 Acute respiratory failure with hypoxia: Secondary | ICD-10-CM | POA: Diagnosis not present

## 2021-06-22 DIAGNOSIS — R531 Weakness: Secondary | ICD-10-CM | POA: Diagnosis not present

## 2021-06-22 DIAGNOSIS — S42412D Displaced simple supracondylar fracture without intercondylar fracture of left humerus, subsequent encounter for fracture with routine healing: Secondary | ICD-10-CM | POA: Diagnosis not present

## 2021-06-22 DIAGNOSIS — K625 Hemorrhage of anus and rectum: Secondary | ICD-10-CM | POA: Diagnosis not present

## 2021-06-22 DIAGNOSIS — M19042 Primary osteoarthritis, left hand: Secondary | ICD-10-CM | POA: Diagnosis not present

## 2021-06-22 DIAGNOSIS — L89150 Pressure ulcer of sacral region, unstageable: Secondary | ICD-10-CM | POA: Diagnosis not present

## 2021-06-22 DIAGNOSIS — M25632 Stiffness of left wrist, not elsewhere classified: Secondary | ICD-10-CM | POA: Diagnosis not present

## 2021-06-22 DIAGNOSIS — R309 Painful micturition, unspecified: Secondary | ICD-10-CM | POA: Diagnosis not present

## 2021-06-22 DIAGNOSIS — R399 Unspecified symptoms and signs involving the genitourinary system: Secondary | ICD-10-CM | POA: Diagnosis not present

## 2021-06-22 DIAGNOSIS — N3281 Overactive bladder: Secondary | ICD-10-CM | POA: Diagnosis not present

## 2021-06-22 DIAGNOSIS — S42412A Displaced simple supracondylar fracture without intercondylar fracture of left humerus, initial encounter for closed fracture: Secondary | ICD-10-CM | POA: Diagnosis not present

## 2021-06-22 DIAGNOSIS — I4891 Unspecified atrial fibrillation: Secondary | ICD-10-CM | POA: Diagnosis not present

## 2021-06-26 DIAGNOSIS — I480 Paroxysmal atrial fibrillation: Secondary | ICD-10-CM | POA: Diagnosis not present

## 2021-06-26 DIAGNOSIS — R5381 Other malaise: Secondary | ICD-10-CM | POA: Diagnosis not present

## 2021-06-26 DIAGNOSIS — I10 Essential (primary) hypertension: Secondary | ICD-10-CM | POA: Diagnosis not present

## 2021-06-26 DIAGNOSIS — H409 Unspecified glaucoma: Secondary | ICD-10-CM | POA: Diagnosis not present

## 2021-06-26 DIAGNOSIS — E785 Hyperlipidemia, unspecified: Secondary | ICD-10-CM | POA: Diagnosis not present

## 2021-06-26 DIAGNOSIS — K219 Gastro-esophageal reflux disease without esophagitis: Secondary | ICD-10-CM | POA: Diagnosis not present

## 2021-06-26 DIAGNOSIS — N3281 Overactive bladder: Secondary | ICD-10-CM | POA: Diagnosis not present

## 2021-06-26 DIAGNOSIS — S42302A Unspecified fracture of shaft of humerus, left arm, initial encounter for closed fracture: Secondary | ICD-10-CM | POA: Diagnosis not present

## 2021-07-02 DIAGNOSIS — R8281 Pyuria: Secondary | ICD-10-CM | POA: Diagnosis not present

## 2021-07-02 DIAGNOSIS — I4891 Unspecified atrial fibrillation: Secondary | ICD-10-CM | POA: Diagnosis not present

## 2021-07-02 DIAGNOSIS — M810 Age-related osteoporosis without current pathological fracture: Secondary | ICD-10-CM | POA: Diagnosis not present

## 2021-07-05 DIAGNOSIS — S42302A Unspecified fracture of shaft of humerus, left arm, initial encounter for closed fracture: Secondary | ICD-10-CM | POA: Diagnosis not present

## 2021-07-10 DIAGNOSIS — E785 Hyperlipidemia, unspecified: Secondary | ICD-10-CM | POA: Diagnosis not present

## 2021-07-10 DIAGNOSIS — R2232 Localized swelling, mass and lump, left upper limb: Secondary | ICD-10-CM | POA: Diagnosis not present

## 2021-07-10 DIAGNOSIS — S42302A Unspecified fracture of shaft of humerus, left arm, initial encounter for closed fracture: Secondary | ICD-10-CM | POA: Diagnosis not present

## 2021-07-10 DIAGNOSIS — R5381 Other malaise: Secondary | ICD-10-CM | POA: Diagnosis not present

## 2021-07-10 DIAGNOSIS — K219 Gastro-esophageal reflux disease without esophagitis: Secondary | ICD-10-CM | POA: Diagnosis not present

## 2021-07-10 DIAGNOSIS — I10 Essential (primary) hypertension: Secondary | ICD-10-CM | POA: Diagnosis not present

## 2021-07-10 DIAGNOSIS — I480 Paroxysmal atrial fibrillation: Secondary | ICD-10-CM | POA: Diagnosis not present

## 2021-07-12 DIAGNOSIS — S42412D Displaced simple supracondylar fracture without intercondylar fracture of left humerus, subsequent encounter for fracture with routine healing: Secondary | ICD-10-CM | POA: Diagnosis not present

## 2021-07-25 DIAGNOSIS — R5381 Other malaise: Secondary | ICD-10-CM | POA: Diagnosis not present

## 2021-07-25 DIAGNOSIS — R2232 Localized swelling, mass and lump, left upper limb: Secondary | ICD-10-CM | POA: Diagnosis not present

## 2021-07-25 DIAGNOSIS — I480 Paroxysmal atrial fibrillation: Secondary | ICD-10-CM | POA: Diagnosis not present

## 2021-07-25 DIAGNOSIS — H409 Unspecified glaucoma: Secondary | ICD-10-CM | POA: Diagnosis not present

## 2021-07-25 DIAGNOSIS — K219 Gastro-esophageal reflux disease without esophagitis: Secondary | ICD-10-CM | POA: Diagnosis not present

## 2021-07-25 DIAGNOSIS — S42302A Unspecified fracture of shaft of humerus, left arm, initial encounter for closed fracture: Secondary | ICD-10-CM | POA: Diagnosis not present

## 2021-07-25 DIAGNOSIS — E785 Hyperlipidemia, unspecified: Secondary | ICD-10-CM | POA: Diagnosis not present

## 2021-07-25 DIAGNOSIS — I10 Essential (primary) hypertension: Secondary | ICD-10-CM | POA: Diagnosis not present

## 2021-07-30 ENCOUNTER — Other Ambulatory Visit: Payer: Self-pay

## 2021-07-30 MED ORDER — IBANDRONATE SODIUM 150 MG PO TABS: 150.0000 mg | ORAL_TABLET | ORAL | 3 refills | Status: AC

## 2021-07-30 NOTE — Progress Notes (Signed)
Refill sent. ___________________________________________ Clearnce Sorrel, DNP, APRN, FNP-BC [facility] [place]

## 2021-07-30 NOTE — Progress Notes (Signed)
Last filled 08/11/2020  Last OV 05/14/2021  [facility] is requesting a refill for this patient.

## 2021-07-31 DIAGNOSIS — R2232 Localized swelling, mass and lump, left upper limb: Secondary | ICD-10-CM | POA: Diagnosis not present

## 2021-07-31 DIAGNOSIS — I480 Paroxysmal atrial fibrillation: Secondary | ICD-10-CM | POA: Diagnosis not present

## 2021-07-31 DIAGNOSIS — I10 Essential (primary) hypertension: Secondary | ICD-10-CM | POA: Diagnosis not present

## 2021-07-31 DIAGNOSIS — E785 Hyperlipidemia, unspecified: Secondary | ICD-10-CM | POA: Diagnosis not present

## 2021-07-31 DIAGNOSIS — N3281 Overactive bladder: Secondary | ICD-10-CM | POA: Diagnosis not present

## 2021-07-31 DIAGNOSIS — R5381 Other malaise: Secondary | ICD-10-CM | POA: Diagnosis not present

## 2021-07-31 DIAGNOSIS — K219 Gastro-esophageal reflux disease without esophagitis: Secondary | ICD-10-CM | POA: Diagnosis not present

## 2021-07-31 DIAGNOSIS — S42302A Unspecified fracture of shaft of humerus, left arm, initial encounter for closed fracture: Secondary | ICD-10-CM | POA: Diagnosis not present

## 2021-07-31 DIAGNOSIS — H409 Unspecified glaucoma: Secondary | ICD-10-CM | POA: Diagnosis not present

## 2021-08-02 DIAGNOSIS — S42412D Displaced simple supracondylar fracture without intercondylar fracture of left humerus, subsequent encounter for fracture with routine healing: Secondary | ICD-10-CM | POA: Diagnosis not present

## 2021-08-07 DIAGNOSIS — S42302A Unspecified fracture of shaft of humerus, left arm, initial encounter for closed fracture: Secondary | ICD-10-CM | POA: Diagnosis not present

## 2021-08-07 DIAGNOSIS — R2232 Localized swelling, mass and lump, left upper limb: Secondary | ICD-10-CM | POA: Diagnosis not present

## 2021-08-08 DIAGNOSIS — I480 Paroxysmal atrial fibrillation: Secondary | ICD-10-CM | POA: Diagnosis not present

## 2021-08-08 DIAGNOSIS — E785 Hyperlipidemia, unspecified: Secondary | ICD-10-CM | POA: Diagnosis not present

## 2021-08-08 DIAGNOSIS — R5381 Other malaise: Secondary | ICD-10-CM | POA: Diagnosis not present

## 2021-08-08 DIAGNOSIS — I10 Essential (primary) hypertension: Secondary | ICD-10-CM | POA: Diagnosis not present

## 2021-08-08 DIAGNOSIS — K219 Gastro-esophageal reflux disease without esophagitis: Secondary | ICD-10-CM | POA: Diagnosis not present

## 2021-08-08 DIAGNOSIS — N3281 Overactive bladder: Secondary | ICD-10-CM | POA: Diagnosis not present

## 2021-08-08 DIAGNOSIS — H409 Unspecified glaucoma: Secondary | ICD-10-CM | POA: Diagnosis not present

## 2021-08-08 DIAGNOSIS — S42302A Unspecified fracture of shaft of humerus, left arm, initial encounter for closed fracture: Secondary | ICD-10-CM | POA: Diagnosis not present

## 2021-08-08 DIAGNOSIS — R2232 Localized swelling, mass and lump, left upper limb: Secondary | ICD-10-CM | POA: Diagnosis not present

## 2021-08-15 ENCOUNTER — Other Ambulatory Visit: Payer: Self-pay | Admitting: Osteopathic Medicine

## 2021-08-15 DIAGNOSIS — R5381 Other malaise: Secondary | ICD-10-CM | POA: Diagnosis not present

## 2021-08-15 DIAGNOSIS — L539 Erythematous condition, unspecified: Secondary | ICD-10-CM | POA: Diagnosis not present

## 2021-08-15 DIAGNOSIS — I480 Paroxysmal atrial fibrillation: Secondary | ICD-10-CM | POA: Diagnosis not present

## 2021-08-15 DIAGNOSIS — S42302A Unspecified fracture of shaft of humerus, left arm, initial encounter for closed fracture: Secondary | ICD-10-CM | POA: Diagnosis not present

## 2021-08-15 DIAGNOSIS — I10 Essential (primary) hypertension: Secondary | ICD-10-CM | POA: Diagnosis not present

## 2021-08-15 DIAGNOSIS — K219 Gastro-esophageal reflux disease without esophagitis: Secondary | ICD-10-CM | POA: Diagnosis not present

## 2021-08-15 DIAGNOSIS — E785 Hyperlipidemia, unspecified: Secondary | ICD-10-CM | POA: Diagnosis not present

## 2021-08-15 DIAGNOSIS — E7800 Pure hypercholesterolemia, unspecified: Secondary | ICD-10-CM

## 2021-08-20 ENCOUNTER — Other Ambulatory Visit: Payer: Self-pay

## 2021-08-20 DIAGNOSIS — E7800 Pure hypercholesterolemia, unspecified: Secondary | ICD-10-CM

## 2021-08-20 MED ORDER — PRAVASTATIN SODIUM 10 MG PO TABS: ORAL_TABLET | ORAL | 0 refills | Status: AC

## 2021-08-23 DIAGNOSIS — K625 Hemorrhage of anus and rectum: Secondary | ICD-10-CM | POA: Diagnosis not present

## 2021-08-24 DIAGNOSIS — E785 Hyperlipidemia, unspecified: Secondary | ICD-10-CM | POA: Diagnosis not present

## 2021-08-24 DIAGNOSIS — S42302A Unspecified fracture of shaft of humerus, left arm, initial encounter for closed fracture: Secondary | ICD-10-CM | POA: Diagnosis not present

## 2021-08-24 DIAGNOSIS — K649 Unspecified hemorrhoids: Secondary | ICD-10-CM | POA: Diagnosis not present

## 2021-08-24 DIAGNOSIS — I10 Essential (primary) hypertension: Secondary | ICD-10-CM | POA: Diagnosis not present

## 2021-08-24 DIAGNOSIS — K625 Hemorrhage of anus and rectum: Secondary | ICD-10-CM | POA: Diagnosis not present

## 2021-08-24 DIAGNOSIS — I480 Paroxysmal atrial fibrillation: Secondary | ICD-10-CM | POA: Diagnosis not present

## 2021-08-24 DIAGNOSIS — R2232 Localized swelling, mass and lump, left upper limb: Secondary | ICD-10-CM | POA: Diagnosis not present

## 2021-08-24 DIAGNOSIS — H409 Unspecified glaucoma: Secondary | ICD-10-CM | POA: Diagnosis not present

## 2021-08-24 DIAGNOSIS — R5381 Other malaise: Secondary | ICD-10-CM | POA: Diagnosis not present

## 2021-08-24 DIAGNOSIS — N3281 Overactive bladder: Secondary | ICD-10-CM | POA: Diagnosis not present

## 2021-08-27 DIAGNOSIS — K625 Hemorrhage of anus and rectum: Secondary | ICD-10-CM | POA: Diagnosis not present

## 2021-09-02 DIAGNOSIS — J439 Emphysema, unspecified: Secondary | ICD-10-CM | POA: Diagnosis not present

## 2021-09-02 DIAGNOSIS — Z95818 Presence of other cardiac implants and grafts: Secondary | ICD-10-CM | POA: Diagnosis not present

## 2021-09-02 DIAGNOSIS — R0602 Shortness of breath: Secondary | ICD-10-CM | POA: Diagnosis not present

## 2021-09-02 DIAGNOSIS — I517 Cardiomegaly: Secondary | ICD-10-CM | POA: Diagnosis not present

## 2021-09-03 DIAGNOSIS — J9601 Acute respiratory failure with hypoxia: Secondary | ICD-10-CM | POA: Diagnosis not present

## 2021-09-04 DIAGNOSIS — W19XXXA Unspecified fall, initial encounter: Secondary | ICD-10-CM | POA: Diagnosis not present

## 2021-09-04 DIAGNOSIS — S42302A Unspecified fracture of shaft of humerus, left arm, initial encounter for closed fracture: Secondary | ICD-10-CM | POA: Diagnosis not present

## 2021-09-06 DIAGNOSIS — S42412D Displaced simple supracondylar fracture without intercondylar fracture of left humerus, subsequent encounter for fracture with routine healing: Secondary | ICD-10-CM | POA: Diagnosis not present

## 2021-09-08 DIAGNOSIS — S42412D Displaced simple supracondylar fracture without intercondylar fracture of left humerus, subsequent encounter for fracture with routine healing: Secondary | ICD-10-CM | POA: Diagnosis not present

## 2021-09-08 DIAGNOSIS — M6281 Muscle weakness (generalized): Secondary | ICD-10-CM | POA: Diagnosis not present

## 2021-09-10 DIAGNOSIS — S42412D Displaced simple supracondylar fracture without intercondylar fracture of left humerus, subsequent encounter for fracture with routine healing: Secondary | ICD-10-CM | POA: Diagnosis not present

## 2021-09-10 DIAGNOSIS — M6281 Muscle weakness (generalized): Secondary | ICD-10-CM | POA: Diagnosis not present

## 2021-09-11 DIAGNOSIS — S42412D Displaced simple supracondylar fracture without intercondylar fracture of left humerus, subsequent encounter for fracture with routine healing: Secondary | ICD-10-CM | POA: Diagnosis not present

## 2021-09-11 DIAGNOSIS — S42302A Unspecified fracture of shaft of humerus, left arm, initial encounter for closed fracture: Secondary | ICD-10-CM | POA: Diagnosis not present

## 2021-09-11 DIAGNOSIS — N3281 Overactive bladder: Secondary | ICD-10-CM | POA: Diagnosis not present

## 2021-09-11 DIAGNOSIS — M6281 Muscle weakness (generalized): Secondary | ICD-10-CM | POA: Diagnosis not present

## 2021-09-12 DIAGNOSIS — M6281 Muscle weakness (generalized): Secondary | ICD-10-CM | POA: Diagnosis not present

## 2021-09-12 DIAGNOSIS — H409 Unspecified glaucoma: Secondary | ICD-10-CM | POA: Diagnosis not present

## 2021-09-12 DIAGNOSIS — K219 Gastro-esophageal reflux disease without esophagitis: Secondary | ICD-10-CM | POA: Diagnosis not present

## 2021-09-12 DIAGNOSIS — S42412D Displaced simple supracondylar fracture without intercondylar fracture of left humerus, subsequent encounter for fracture with routine healing: Secondary | ICD-10-CM | POA: Diagnosis not present

## 2021-09-12 DIAGNOSIS — N3281 Overactive bladder: Secondary | ICD-10-CM | POA: Diagnosis not present

## 2021-09-12 DIAGNOSIS — I480 Paroxysmal atrial fibrillation: Secondary | ICD-10-CM | POA: Diagnosis not present

## 2021-09-12 DIAGNOSIS — S42302A Unspecified fracture of shaft of humerus, left arm, initial encounter for closed fracture: Secondary | ICD-10-CM | POA: Diagnosis not present

## 2021-09-12 DIAGNOSIS — E785 Hyperlipidemia, unspecified: Secondary | ICD-10-CM | POA: Diagnosis not present

## 2021-09-12 DIAGNOSIS — R2232 Localized swelling, mass and lump, left upper limb: Secondary | ICD-10-CM | POA: Diagnosis not present

## 2021-09-12 DIAGNOSIS — I10 Essential (primary) hypertension: Secondary | ICD-10-CM | POA: Diagnosis not present

## 2021-09-12 DIAGNOSIS — K625 Hemorrhage of anus and rectum: Secondary | ICD-10-CM | POA: Diagnosis not present

## 2021-09-13 DIAGNOSIS — S42412D Displaced simple supracondylar fracture without intercondylar fracture of left humerus, subsequent encounter for fracture with routine healing: Secondary | ICD-10-CM | POA: Diagnosis not present

## 2021-09-13 DIAGNOSIS — M6281 Muscle weakness (generalized): Secondary | ICD-10-CM | POA: Diagnosis not present

## 2021-09-14 DIAGNOSIS — M6281 Muscle weakness (generalized): Secondary | ICD-10-CM | POA: Diagnosis not present

## 2021-09-14 DIAGNOSIS — S42412D Displaced simple supracondylar fracture without intercondylar fracture of left humerus, subsequent encounter for fracture with routine healing: Secondary | ICD-10-CM | POA: Diagnosis not present

## 2021-09-16 DIAGNOSIS — S42412D Displaced simple supracondylar fracture without intercondylar fracture of left humerus, subsequent encounter for fracture with routine healing: Secondary | ICD-10-CM | POA: Diagnosis not present

## 2021-09-16 DIAGNOSIS — M6281 Muscle weakness (generalized): Secondary | ICD-10-CM | POA: Diagnosis not present

## 2021-09-18 DIAGNOSIS — S42412D Displaced simple supracondylar fracture without intercondylar fracture of left humerus, subsequent encounter for fracture with routine healing: Secondary | ICD-10-CM | POA: Diagnosis not present

## 2021-09-18 DIAGNOSIS — M6281 Muscle weakness (generalized): Secondary | ICD-10-CM | POA: Diagnosis not present

## 2021-09-19 DIAGNOSIS — M6281 Muscle weakness (generalized): Secondary | ICD-10-CM | POA: Diagnosis not present

## 2021-09-19 DIAGNOSIS — S42412D Displaced simple supracondylar fracture without intercondylar fracture of left humerus, subsequent encounter for fracture with routine healing: Secondary | ICD-10-CM | POA: Diagnosis not present

## 2021-09-20 DIAGNOSIS — S42412D Displaced simple supracondylar fracture without intercondylar fracture of left humerus, subsequent encounter for fracture with routine healing: Secondary | ICD-10-CM | POA: Diagnosis not present

## 2021-09-20 DIAGNOSIS — M6281 Muscle weakness (generalized): Secondary | ICD-10-CM | POA: Diagnosis not present

## 2021-09-21 DIAGNOSIS — S42412D Displaced simple supracondylar fracture without intercondylar fracture of left humerus, subsequent encounter for fracture with routine healing: Secondary | ICD-10-CM | POA: Diagnosis not present

## 2021-09-21 DIAGNOSIS — M6281 Muscle weakness (generalized): Secondary | ICD-10-CM | POA: Diagnosis not present

## 2021-09-24 DIAGNOSIS — M6281 Muscle weakness (generalized): Secondary | ICD-10-CM | POA: Diagnosis not present

## 2021-09-24 DIAGNOSIS — S42412D Displaced simple supracondylar fracture without intercondylar fracture of left humerus, subsequent encounter for fracture with routine healing: Secondary | ICD-10-CM | POA: Diagnosis not present

## 2021-09-25 DIAGNOSIS — L89150 Pressure ulcer of sacral region, unstageable: Secondary | ICD-10-CM | POA: Diagnosis not present

## 2021-09-25 DIAGNOSIS — M6281 Muscle weakness (generalized): Secondary | ICD-10-CM | POA: Diagnosis not present

## 2021-09-25 DIAGNOSIS — S42412D Displaced simple supracondylar fracture without intercondylar fracture of left humerus, subsequent encounter for fracture with routine healing: Secondary | ICD-10-CM | POA: Diagnosis not present

## 2021-09-26 DIAGNOSIS — M6281 Muscle weakness (generalized): Secondary | ICD-10-CM | POA: Diagnosis not present

## 2021-09-26 DIAGNOSIS — S42412D Displaced simple supracondylar fracture without intercondylar fracture of left humerus, subsequent encounter for fracture with routine healing: Secondary | ICD-10-CM | POA: Diagnosis not present

## 2021-09-28 DIAGNOSIS — S42412D Displaced simple supracondylar fracture without intercondylar fracture of left humerus, subsequent encounter for fracture with routine healing: Secondary | ICD-10-CM | POA: Diagnosis not present

## 2021-09-28 DIAGNOSIS — M6281 Muscle weakness (generalized): Secondary | ICD-10-CM | POA: Diagnosis not present

## 2021-09-30 DIAGNOSIS — R309 Painful micturition, unspecified: Secondary | ICD-10-CM | POA: Diagnosis not present

## 2021-10-01 DIAGNOSIS — M6281 Muscle weakness (generalized): Secondary | ICD-10-CM | POA: Diagnosis not present

## 2021-10-01 DIAGNOSIS — S42412D Displaced simple supracondylar fracture without intercondylar fracture of left humerus, subsequent encounter for fracture with routine healing: Secondary | ICD-10-CM | POA: Diagnosis not present

## 2021-10-02 DIAGNOSIS — S42412D Displaced simple supracondylar fracture without intercondylar fracture of left humerus, subsequent encounter for fracture with routine healing: Secondary | ICD-10-CM | POA: Diagnosis not present

## 2021-10-02 DIAGNOSIS — M6281 Muscle weakness (generalized): Secondary | ICD-10-CM | POA: Diagnosis not present

## 2021-10-03 DIAGNOSIS — S42412D Displaced simple supracondylar fracture without intercondylar fracture of left humerus, subsequent encounter for fracture with routine healing: Secondary | ICD-10-CM | POA: Diagnosis not present

## 2021-10-03 DIAGNOSIS — M6281 Muscle weakness (generalized): Secondary | ICD-10-CM | POA: Diagnosis not present

## 2021-10-04 DIAGNOSIS — S42412D Displaced simple supracondylar fracture without intercondylar fracture of left humerus, subsequent encounter for fracture with routine healing: Secondary | ICD-10-CM | POA: Diagnosis not present

## 2021-10-04 DIAGNOSIS — M6281 Muscle weakness (generalized): Secondary | ICD-10-CM | POA: Diagnosis not present

## 2021-10-09 DIAGNOSIS — L89150 Pressure ulcer of sacral region, unstageable: Secondary | ICD-10-CM | POA: Diagnosis not present

## 2021-10-11 DIAGNOSIS — F0390 Unspecified dementia without behavioral disturbance: Secondary | ICD-10-CM | POA: Diagnosis not present

## 2021-10-11 DIAGNOSIS — H6012 Cellulitis of left external ear: Secondary | ICD-10-CM | POA: Diagnosis not present

## 2021-10-16 ENCOUNTER — Ambulatory Visit: Payer: Medicare Other | Admitting: Medical-Surgical

## 2021-10-16 DIAGNOSIS — F03918 Unspecified dementia, unspecified severity, with other behavioral disturbance: Secondary | ICD-10-CM | POA: Diagnosis not present

## 2021-10-16 DIAGNOSIS — B370 Candidal stomatitis: Secondary | ICD-10-CM | POA: Diagnosis not present

## 2021-10-16 DIAGNOSIS — H6012 Cellulitis of left external ear: Secondary | ICD-10-CM | POA: Diagnosis not present

## 2021-10-17 DIAGNOSIS — K140 Glossitis: Secondary | ICD-10-CM | POA: Diagnosis not present

## 2021-10-17 DIAGNOSIS — I10 Essential (primary) hypertension: Secondary | ICD-10-CM | POA: Diagnosis not present

## 2021-10-17 DIAGNOSIS — B370 Candidal stomatitis: Secondary | ICD-10-CM | POA: Diagnosis not present

## 2021-10-17 DIAGNOSIS — R399 Unspecified symptoms and signs involving the genitourinary system: Secondary | ICD-10-CM | POA: Diagnosis not present

## 2021-10-18 DIAGNOSIS — R8290 Unspecified abnormal findings in urine: Secondary | ICD-10-CM | POA: Diagnosis not present

## 2021-10-19 DIAGNOSIS — R399 Unspecified symptoms and signs involving the genitourinary system: Secondary | ICD-10-CM | POA: Diagnosis not present

## 2021-10-19 DIAGNOSIS — F03918 Unspecified dementia, unspecified severity, with other behavioral disturbance: Secondary | ICD-10-CM | POA: Diagnosis not present

## 2021-11-18 DEATH — deceased

## 2022-04-18 ENCOUNTER — Telehealth: Payer: Self-pay | Admitting: Neurology

## 2022-04-18 NOTE — Telephone Encounter (Signed)
Pt passed away 10/21/2021.

## 2022-04-25 ENCOUNTER — Ambulatory Visit: Payer: Medicare Other | Admitting: Neurology

## 2022-06-12 ENCOUNTER — Telehealth: Payer: Self-pay | Admitting: General Practice

## 2022-06-12 NOTE — Telephone Encounter (Signed)
Called patient to schedule Medicare Annual Wellness Visit (AWV). Left message for patient to call back and schedule Medicare Annual Wellness Visit (AWV).  Last date of AWV: 08/11/20   Please schedule an appointment at any time with Nurse health advisor.  If any questions, please contact me at [phone].  Thank you ,  Modesto Charon, RN BSN
